# Patient Record
Sex: Female | Born: 1985
Health system: Southern US, Community
[De-identification: ages and names within clinical notes are randomized; demographics above are authoritative.]

## PROBLEM LIST (undated history)

## (undated) ENCOUNTER — Inpatient Hospital Stay (HOSPITAL_COMMUNITY): Payer: BLUE CROSS/BLUE SHIELD

## (undated) ENCOUNTER — Inpatient Hospital Stay (HOSPITAL_COMMUNITY): Payer: Self-pay

## (undated) DIAGNOSIS — R824 Acetonuria: Secondary | ICD-10-CM

## (undated) DIAGNOSIS — Z8759 Personal history of other complications of pregnancy, childbirth and the puerperium: Secondary | ICD-10-CM

## (undated) DIAGNOSIS — F419 Anxiety disorder, unspecified: Secondary | ICD-10-CM

## (undated) DIAGNOSIS — O139 Gestational [pregnancy-induced] hypertension without significant proteinuria, unspecified trimester: Secondary | ICD-10-CM

## (undated) DIAGNOSIS — F329 Major depressive disorder, single episode, unspecified: Secondary | ICD-10-CM

## (undated) DIAGNOSIS — K219 Gastro-esophageal reflux disease without esophagitis: Secondary | ICD-10-CM

## (undated) DIAGNOSIS — R34 Anuria and oliguria: Secondary | ICD-10-CM

## (undated) DIAGNOSIS — R51 Headache: Secondary | ICD-10-CM

## (undated) DIAGNOSIS — R519 Headache, unspecified: Secondary | ICD-10-CM

## (undated) DIAGNOSIS — E860 Dehydration: Secondary | ICD-10-CM

## (undated) HISTORY — PX: WISDOM TOOTH EXTRACTION: SHX21

---
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
## 2012-09-20 ENCOUNTER — Ambulatory Visit: Payer: Self-pay | Admitting: General Practice

## 2012-10-11 ENCOUNTER — Encounter (HOSPITAL_COMMUNITY): Payer: Self-pay | Admitting: Emergency Medicine

## 2012-10-11 ENCOUNTER — Emergency Department (HOSPITAL_COMMUNITY)
Admission: EM | Admit: 2012-10-11 | Discharge: 2012-10-11 | Disposition: A | Discharge status: Home / Self Care | Payer: BC Managed Care – PPO | Attending: Emergency Medicine | Admitting: Emergency Medicine

## 2012-10-11 ENCOUNTER — Emergency Department (HOSPITAL_COMMUNITY): Payer: BC Managed Care – PPO

## 2012-10-11 DIAGNOSIS — R209 Unspecified disturbances of skin sensation: Secondary | ICD-10-CM | POA: Insufficient documentation

## 2012-10-11 DIAGNOSIS — R0602 Shortness of breath: Secondary | ICD-10-CM | POA: Insufficient documentation

## 2012-10-11 DIAGNOSIS — R0789 Other chest pain: Secondary | ICD-10-CM

## 2012-10-11 DIAGNOSIS — Z79899 Other long term (current) drug therapy: Secondary | ICD-10-CM | POA: Insufficient documentation

## 2012-10-11 DIAGNOSIS — R42 Dizziness and giddiness: Secondary | ICD-10-CM | POA: Insufficient documentation

## 2012-10-11 LAB — CBC WITH DIFFERENTIAL/PLATELET
Basophils Absolute: 0 10*3/uL (ref 0.0–0.1)
Basophils Relative: 0 % (ref 0–1)
Eosinophils Absolute: 0.3 10*3/uL (ref 0.0–0.7)
HCT: 41.2 % (ref 36.0–46.0)
MCH: 31.4 pg (ref 26.0–34.0)
MCHC: 35.9 g/dL (ref 30.0–36.0)
Monocytes Absolute: 0.6 10*3/uL (ref 0.1–1.0)
Neutro Abs: 4.2 10*3/uL (ref 1.7–7.7)
RDW: 11.8 % (ref 11.5–15.5)

## 2012-10-11 LAB — URINE MICROSCOPIC-ADD ON

## 2012-10-11 LAB — BASIC METABOLIC PANEL
BUN: 11 mg/dL (ref 6–23)
Chloride: 104 mEq/L (ref 96–112)
Creatinine, Ser: 0.57 mg/dL (ref 0.50–1.10)
GFR calc Af Amer: >90 mL/min (ref >90)
GFR calc non Af Amer: >90 mL/min (ref >90)
Glucose, Bld: 89 mg/dL (ref 70–99)

## 2012-10-11 LAB — PREGNANCY, URINE: Preg Test, Ur: NEGATIVE

## 2012-10-11 LAB — URINALYSIS, ROUTINE W REFLEX MICROSCOPIC
Ketones, ur: NEGATIVE mg/dL
Leukocytes, UA: NEGATIVE
Protein, ur: NEGATIVE mg/dL
Urobilinogen, UA: 0.2 mg/dL (ref 0.0–1.0)

## 2012-10-11 MED ORDER — FAMOTIDINE 20 MG PO TABS: 20.0000 mg | ORAL_TABLET | Freq: Two times a day (BID) | ORAL | Status: DC

## 2012-10-11 MED ORDER — GI COCKTAIL ~~LOC~~
30.0000 mL | Freq: Once | ORAL | Status: AC
  Administered 2012-10-11: 30 mL via ORAL
  Filled 2012-10-11: qty 30

## 2012-10-11 NOTE — Progress Notes (Signed)
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  She has been having episodes of chest pain, has seen Dr. Phillips Odor, and has had echocardiogram in the recent past.  Her lab workup today is entirely negative.  The plan for further workup is to see a pulmonologist.  She was advised that her exam and lab tests were reassuringly normal, and that it is safe to go home and to return to work in 2 days.

## 2012-10-11 NOTE — ED Notes (Signed)
Pt reports chest pressure has decreased, now rates it a 3.

## 2012-10-11 NOTE — ED Provider Notes (Signed)
6:34 PM  Date: 10/11/2012  Rate: 70  Rhythm: normal sinus rhythm  QRS Axis: normal  Intervals: normal  ST/T Wave abnormalities: normal  Conduction Disutrbances:none  Narrative Interpretation: Normal EKG  Old EKG Reviewed: none available    Carleene Cooper III, MD 10/11/12 [phone]

## 2012-10-11 NOTE — ED Notes (Signed)
Discharge instructions reviewed with pt, questions answered. Pt verbalized understanding.  

## 2012-10-11 NOTE — ED Notes (Signed)
Pt states she was at work and developed chest pain, sob and dizziness. Pt states her extremities are tingling.

## 2012-10-12 NOTE — ED Provider Notes (Signed)
History     CSN: [id number]  Arrival date & time 10/11/12  1818   First MD Initiated Contact with Patient 10/11/12 2020      Chief Complaint  Patient presents with  . Chest Pain  . Dizziness  . Shortness of Breath    (Consider location/radiation/quality/duration/timing/severity/associated sxs/prior treatment) HPI Comments: Carrie Marsh presents with another occurrence of hot flushing,  Followed by mid sternal chest pressure,  Shortness of breath which escalated to peripheral numbness and near syncope while at work tonight.  She has a history of previous episodes of similar events and has been evaluated with an echocardiogram in [place] which was found to be normal, except for a small mitral valve prolapse.  She is next anticipating an evaluation by pulmonology to assess for possible asthma as these episodes are sometimes accompanied by wheezing.  Her pcp has given her an albuterol inhaler which is moderately helpful.  She did not have wheezing with tonights event.  The episode tonight lasted about 15 minutes and improved after she sat down and "caught her breath".  She still had residual mid sternal chest pressure pm first arrival here which is resolved.  Patient is a 26 y.o. female presenting with shortness of breath. The history is provided by the patient.  Shortness of Breath  Associated symptoms include shortness of breath. Pertinent negatives include no chest pain, no fever and no sore throat.    History reviewed. No pertinent past medical history.  History reviewed. No pertinent past surgical history.  History reviewed. No pertinent family history.  History  Substance Use Topics  . Smoking status: Not on file  . Smokeless tobacco: Not on file  . Alcohol Use: No    OB History    Grav Para Term Preterm Abortions TAB SAB Ect Mult Living                  Review of Systems  Constitutional: Negative for fever.  HENT: Negative for congestion, sore throat and neck  pain.   Eyes: Negative.   Respiratory: Positive for chest tightness and shortness of breath.   Cardiovascular: Negative for chest pain, palpitations and leg swelling.  Gastrointestinal: Negative for nausea and abdominal pain.  Genitourinary: Negative.   Musculoskeletal: Negative for joint swelling and arthralgias.  Skin: Negative.  Negative for rash and wound.  Neurological: Positive for light-headedness and numbness. Negative for dizziness, weakness and headaches.  Hematological: Negative.   Psychiatric/Behavioral: Negative.     Allergies  Review of patient's allergies indicates no known allergies.  Home Medications   Current Outpatient Rx  Name  Route  Sig  Dispense  Refill  . ALBUTEROL SULFATE HFA 108 (90 BASE) MCG/ACT IN AERS   Inhalation   Inhale 2 puffs into the lungs every 6 (six) hours as needed. For shortness of breath         . FLUTICASONE PROPIONATE 50 MCG/ACT NA SUSP   Nasal   Place 2 sprays into the nose daily.         Marland Kitchen NORGESTIM-ETH ESTRAD TRIPHASIC 0.18/0.215/0.25 MG-35 MCG PO TABS   Oral   Take 1 tablet by mouth daily.         Marland Kitchen FAMOTIDINE 20 MG PO TABS   Oral   Take 1 tablet (20 mg total) by mouth 2 (two) times daily.   30 tablet   0     BP 116/77  Pulse 66  Temp 97.3 F (36.3 C) (Oral)  Resp 18  Ht  5\' 2"  (1.575 m)  Wt 142 lb (64.411 kg)  BMI 25.97 kg/m2  SpO2 98%  LMP 10/11/2012  Physical Exam  Nursing note and vitals reviewed. Constitutional: She appears well-developed and well-nourished.  HENT:  Head: Normocephalic and atraumatic.  Eyes: Conjunctivae normal are normal.  Neck: Normal range of motion.  Cardiovascular: Normal rate, regular rhythm, normal heart sounds and intact distal pulses.   No murmur heard. Pulmonary/Chest: Effort normal and breath sounds normal. No respiratory distress. She has no wheezes. She has no rales. She exhibits no tenderness.  Abdominal: Soft. Bowel sounds are normal. There is no tenderness.   Musculoskeletal: Normal range of motion.  Neurological: She is alert.  Skin: Skin is warm and dry.  Psychiatric: She has a normal mood and affect.    ED Course  Procedures (including critical care time)  Labs Reviewed  URINALYSIS, ROUTINE W REFLEX MICROSCOPIC - Abnormal; Notable for the following:    Specific Gravity, Urine >1.030 (*)     Hgb urine dipstick TRACE (*)     All other components within normal limits  TSH - Abnormal; Notable for the following:    TSH 5.410 (*)     All other components within normal limits  URINE MICROSCOPIC-ADD ON - Abnormal; Notable for the following:    Squamous Epithelial / LPF FEW (*)     All other components within normal limits  PREGNANCY, URINE  TROPONIN I  BASIC METABOLIC PANEL  CBC WITH DIFFERENTIAL   Dg Chest 2 View  10/11/2012  *RADIOLOGY REPORT*  Clinical Data: Shortness of breath  CHEST - 2 VIEW  Comparison: None.  Findings: Lungs are clear. No pleural effusion or pneumothorax. The cardiomediastinal contours are within normal limits. The visualized bones and soft tissues are without significant appreciable abnormality.  IMPRESSION: No radiographic evidence of acute cardiopulmonary process.   Original Report Authenticated By: Jearld Lesch, M.D.      1. Chest pain, non-cardiac       MDM  Pt was also seen by Dr Ignacia Palma prior to dc home.  Suspect pt may have anxiety/stress as main source of these intermittent episodes although patient denies having increased stress during these episodes.  She was encouraged to f/u with pcp/ and pulmonology for further management.  Patients labs and/or radiological studies were reviewed during the medical decision making and disposition process.   Pt is perc negative, doubt PE.  ekg normal, cxr clear.        Burgess Amor, [place] 10/12/12 1732

## 2012-10-13 NOTE — ED Provider Notes (Signed)
Medical screening examination/treatment/procedure(s) were conducted as a shared visit with non-physician practitioner(s) and myself.  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 She has been having episodes of chest pain, has seen Dr. Phillips Odor, and has had echocardiogram in the recent past. Her lab workup today is entirely negative. The plan for further workup is to see a pulmonologist. She was advised that her exam and lab tests were reassuringly normal, and that it is safe to go home and to return to work in 2 days.       Carleene Cooper III, MD 10/13/12 [phone]

## 2013-07-24 LAB — OB RESULTS CONSOLE HEPATITIS B SURFACE ANTIGEN: Hepatitis B Surface Ag: NEGATIVE

## 2013-07-24 LAB — OB RESULTS CONSOLE ABO/RH: RH Type: NEGATIVE

## 2013-07-24 LAB — OB RESULTS CONSOLE RUBELLA ANTIBODY, IGM: Rubella: NON-IMMUNE/NOT IMMUNE

## 2013-07-24 LAB — OB RESULTS CONSOLE GC/CHLAMYDIA
CHLAMYDIA, DNA PROBE: NEGATIVE
Gonorrhea: NEGATIVE

## 2013-07-24 LAB — OB RESULTS CONSOLE RPR: RPR: NONREACTIVE

## 2013-07-24 LAB — OB RESULTS CONSOLE ANTIBODY SCREEN: Antibody Screen: NEGATIVE

## 2013-07-24 LAB — OB RESULTS CONSOLE HIV ANTIBODY (ROUTINE TESTING): HIV: NONREACTIVE

## 2014-01-17 ENCOUNTER — Encounter (HOSPITAL_COMMUNITY): Payer: Self-pay | Admitting: *Deleted

## 2014-01-17 ENCOUNTER — Inpatient Hospital Stay (HOSPITAL_COMMUNITY)
Admission: AD | Admit: 2014-01-17 | Discharge: 2014-01-17 | Disposition: A | Discharge status: Home / Self Care | Payer: BC Managed Care – PPO | Source: Ambulatory Visit | Attending: Obstetrics and Gynecology | Admitting: Obstetrics and Gynecology

## 2014-01-17 ENCOUNTER — Inpatient Hospital Stay (HOSPITAL_COMMUNITY): Payer: BC Managed Care – PPO

## 2014-01-17 DIAGNOSIS — Z87891 Personal history of nicotine dependence: Secondary | ICD-10-CM | POA: Insufficient documentation

## 2014-01-17 DIAGNOSIS — O139 Gestational [pregnancy-induced] hypertension without significant proteinuria, unspecified trimester: Secondary | ICD-10-CM | POA: Insufficient documentation

## 2014-01-17 DIAGNOSIS — O169 Unspecified maternal hypertension, unspecified trimester: Secondary | ICD-10-CM

## 2014-01-17 LAB — URINALYSIS, ROUTINE W REFLEX MICROSCOPIC
Bilirubin Urine: NEGATIVE
Glucose, UA: NEGATIVE mg/dL
Hgb urine dipstick: NEGATIVE
KETONES UR: NEGATIVE mg/dL
NITRITE: NEGATIVE
PROTEIN: NEGATIVE mg/dL
Specific Gravity, Urine: 1.01 (ref 1.005–1.030)
UROBILINOGEN UA: 0.2 mg/dL (ref 0.0–1.0)
pH: 7 (ref 5.0–8.0)

## 2014-01-17 LAB — COMPREHENSIVE METABOLIC PANEL
ALT: 11 U/L (ref 0–35)
AST: 17 U/L (ref 0–37)
Albumin: 2.6 g/dL — ABNORMAL LOW (ref 3.5–5.2)
Alkaline Phosphatase: 145 U/L — ABNORMAL HIGH (ref 39–117)
BUN: 4 mg/dL — ABNORMAL LOW (ref 6–23)
CALCIUM: 8.7 mg/dL (ref 8.4–10.5)
CO2: 22 meq/L (ref 19–32)
CREATININE: 0.43 mg/dL — AB (ref 0.50–1.10)
Chloride: 106 mEq/L (ref 96–112)
GLUCOSE: 94 mg/dL (ref 70–99)
Potassium: 3.2 mEq/L — ABNORMAL LOW (ref 3.7–5.3)
SODIUM: 140 meq/L (ref 137–147)
Total Bilirubin: <0.2 mg/dL — ABNORMAL LOW (ref 0.3–1.2)
Total Protein: 6.2 g/dL (ref 6.0–8.3)

## 2014-01-17 LAB — CBC
HEMATOCRIT: 34.2 % — AB (ref 36.0–46.0)
HEMOGLOBIN: 12.2 g/dL (ref 12.0–15.0)
MCH: 31.9 pg (ref 26.0–34.0)
MCHC: 35.7 g/dL (ref 30.0–36.0)
MCV: 89.5 fL (ref 78.0–100.0)
Platelets: 207 10*3/uL (ref 150–400)
RBC: 3.82 MIL/uL — AB (ref 3.87–5.11)
RDW: 13.9 % (ref 11.5–15.5)
WBC: 10.2 10*3/uL (ref 4.0–10.5)

## 2014-01-17 LAB — URINE MICROSCOPIC-ADD ON

## 2014-01-17 LAB — PROTEIN / CREATININE RATIO, URINE
Creatinine, Urine: 55.58 mg/dL
PROTEIN CREATININE RATIO: 0.17 — AB (ref 0.00–0.15)
Total Protein, Urine: 9.7 mg/dL

## 2014-01-17 LAB — URIC ACID: Uric Acid, Serum: 2.6 mg/dL (ref 2.4–7.0)

## 2014-01-17 NOTE — MAU Note (Signed)
PT SAYS SHE WENT TO OFFICE TODAY FOR ROUTINE APPOINTMENT-   HER BP WAS ELEVATED  AND HAS BEEN  FOR 3 VISITS -  ALL  3 VISITS - NEG FOR  PROTEIN.    HAD 1 CRAMP TODAY..  DENIES HSV AND  MRSA.  NO VE IN OFFICE TODAY.    HAS H/A - STARTED AT  2PM.    HAS BEEN GETTING  H/A'S-   YESTERDAY- NO MEDS-  WENT AWAY.  TODAY'S H/A  IS NOT WORSE H/A IN LIFE.      1 WEEK AGO SHE SAW BLACK SPOTS.    LAST Saturday  SHE STARTED FEELING LIKE HER VISION WAS FOGGY-  COMES AND GOES- PLUS FELT LIGHT HEADED.  -    NONE OF THIS PRESENT NOW.     SAYS HAS PAIN UNDER BOTH BREAST -  HAPPENED  TWICE  THIS WEEK-  NONE NOW.

## 2014-01-17 NOTE — MAU Provider Note (Signed)
History     CSN: [id number]  Arrival date and time: 01/17/14 1534   First Provider Initiated Contact with Patient 01/17/14 1605      No chief complaint on file.  HPI  Carrie Marsh is a [age] y.o. G1P0 at 6649w4d who was sent over from the office for evaluation of hypertension. She states that for her last 3 visits. She has had some elevated blood pressures. She has checked it at home a few times and it has been elevated then as well. She reports occasional headaches, and has a headache currently. She declines any medication for her headache at this time. She denies any other complications with this pregnancy. She reports normal fetal movement.   History reviewed. No pertinent past medical history.  Past Surgical History  Procedure Laterality Date  . Wisdom tooth extraction      History reviewed. No pertinent family history.  History  Substance Use Topics  . Smoking status: Former Games developermoker  . Smokeless tobacco: Not on file  . Alcohol Use: No    Allergies: No Known Allergies  Prescriptions prior to admission  Medication Sig Dispense Refill  . acetaminophen (TYLENOL) 500 MG tablet Take 500 mg by mouth every 6 (six) hours as needed for headache.      . albuterol (PROAIR HFA) 108 (90 BASE) MCG/ACT inhaler Inhale 2 puffs into the lungs every 6 (six) hours as needed. For shortness of breath      . Prenatal Vit-Fe Fumarate-FA (PRENATAL MULTIVITAMIN) TABS tablet Take 1 tablet by mouth daily at 12 noon.      . ranitidine (ZANTAC) 75 MG tablet Take 75 mg by mouth 2 (two) times daily.      . famotidine (PEPCID) 20 MG tablet Take 1 tablet (20 mg total) by mouth 2 (two) times daily.  30 tablet  0  . fluticasone (FLONASE) 50 MCG/ACT nasal spray Place 2 sprays into the nose daily.      . Norgestimate-Ethinyl Estradiol Triphasic (TRI-SPRINTEC) 0.18/0.215/0.25 MG-35 MCG tablet Take 1 tablet by mouth daily.        ROS Physical Exam   Blood pressure 125/70, pulse 81, temperature 98.3 F  (36.8 C), temperature source Oral, resp. rate 20, height 5\' 1"  (1.549 m), weight 75.864 kg (167 lb 4 oz).  Physical Exam  Nursing note and vitals reviewed. Constitutional: She is oriented to person, place, and time. She appears well-developed and well-nourished. No distress.  Cardiovascular: Normal rate.   Respiratory: Effort normal.  GI: Soft. There is no tenderness.  Musculoskeletal: She exhibits no edema.  Neurological: She is alert and oriented to person, place, and time.  Patellar reflexes: 1+   Skin: Skin is warm and dry.  Psychiatric: She has a normal mood and affect.   FHT 135, moderate with 15x15 accels and occasional variable Toco: irregular UC, not noticed by the patient.   MAU Course  Procedures Results for orders placed during the hospital encounter of 01/17/14 (from the past 24 hour(s))  URINALYSIS, ROUTINE W REFLEX MICROSCOPIC     Status: Abnormal   Collection Time    01/17/14  3:40 PM      Result Value Ref Range   Color, Urine YELLOW  YELLOW   APPearance CLEAR  CLEAR   Specific Gravity, Urine 1.010  1.005 - 1.030   pH 7.0  5.0 - 8.0   Glucose, UA NEGATIVE  NEGATIVE mg/dL   Hgb urine dipstick NEGATIVE  NEGATIVE   Bilirubin Urine NEGATIVE  NEGATIVE  Ketones, ur NEGATIVE  NEGATIVE mg/dL   Protein, ur NEGATIVE  NEGATIVE mg/dL   Urobilinogen, UA 0.2  0.0 - 1.0 mg/dL   Nitrite NEGATIVE  NEGATIVE   Leukocytes, UA SMALL (*) NEGATIVE  URINE MICROSCOPIC-ADD ON     Status: None   Collection Time    01/17/14  3:40 PM      Result Value Ref Range   Squamous Epithelial / LPF RARE  RARE   WBC, UA 3-6  <3 WBC/hpf   RBC / HPF 0-2  <3 RBC/hpf   Bacteria, UA RARE  RARE   Urine-Other AMORPHOUS URATES/PHOSPHATES    PROTEIN / CREATININE RATIO, URINE     Status: Abnormal   Collection Time    01/17/14  3:40 PM      Result Value Ref Range   Creatinine, Urine 55.58     Total Protein, Urine 9.7     PROTEIN CREATININE RATIO 0.17 (*) 0.00 - 0.15  CBC     Status: Abnormal    Collection Time    01/17/14  4:08 PM      Result Value Ref Range   WBC 10.2  4.0 - 10.5 K/uL   RBC 3.82 (*) 3.87 - 5.11 MIL/uL   Hemoglobin 12.2  12.0 - 15.0 g/dL   HCT 09.8 (*) 11.9 - 14.7 %   MCV 89.5  78.0 - 100.0 fL   MCH 31.9  26.0 - 34.0 pg   MCHC 35.7  30.0 - 36.0 g/dL   RDW 82.9  56.2 - 13.0 %   Platelets 207  150 - 400 K/uL  COMPREHENSIVE METABOLIC PANEL     Status: Abnormal   Collection Time    01/17/14  4:08 PM      Result Value Ref Range   Sodium 140  137 - 147 mEq/L   Potassium 3.2 (*) 3.7 - 5.3 mEq/L   Chloride 106  96 - 112 mEq/L   CO2 22  19 - 32 mEq/L   Glucose, Bld 94  70 - 99 mg/dL   BUN 4 (*) 6 - 23 mg/dL   Creatinine, Ser 8.65 (*) 0.50 - 1.10 mg/dL   Calcium 8.7  8.4 - 78.4 mg/dL   Total Protein 6.2  6.0 - 8.3 g/dL   Albumin 2.6 (*) 3.5 - 5.2 g/dL   AST 17  0 - 37 U/L   ALT 11  0 - 35 U/L   Alkaline Phosphatase 145 (*) 39 - 117 U/L   Total Bilirubin <0.2 (*) 0.3 - 1.2 mg/dL   GFR calc non Af Amer >90  >90 mL/min   GFR calc Af Amer >90  >90 mL/min  URIC ACID     Status: None   Collection Time    01/17/14  4:08 PM      Result Value Ref Range   Uric Acid, Serum 2.6  2.4 - 7.0 mg/dL  6962: D/W Dr. Arelia Sneddon. Will send for BPP/AFI while labs are pending.  1700: Care turned over to Pamelia Hoit, NP Preliminary BPP report 6/8 (fetal breathing not observed) AFI 75% Discussed with Dr. Arelia Sneddon- pt may d/c home and f/u with office tomorrow for BP recheck and eval BP stable BP 120/76 [name], [name] 01/17/2014, 4:20 PM Reactive NST  Assessment and Plan  Hypertension in pregnancy- nl labs D/c home f/u in office in the morning (call in morning) Normal BPP

## 2014-01-31 LAB — OB RESULTS CONSOLE GBS: STREP GROUP B AG: NEGATIVE

## 2014-02-14 ENCOUNTER — Encounter (HOSPITAL_COMMUNITY): Payer: Self-pay | Admitting: *Deleted

## 2014-02-14 ENCOUNTER — Inpatient Hospital Stay (HOSPITAL_COMMUNITY)
Admission: AD | Admit: 2014-02-14 | Discharge: 2014-02-14 | Disposition: A | Discharge status: Home / Self Care | Payer: BC Managed Care – PPO | Source: Ambulatory Visit | Attending: Obstetrics and Gynecology | Admitting: Obstetrics and Gynecology

## 2014-02-14 DIAGNOSIS — O479 False labor, unspecified: Secondary | ICD-10-CM | POA: Insufficient documentation

## 2014-02-14 DIAGNOSIS — O4690 Antepartum hemorrhage, unspecified, unspecified trimester: Secondary | ICD-10-CM | POA: Insufficient documentation

## 2014-02-14 MED ORDER — OXYCODONE-ACETAMINOPHEN 5-325 MG PO TABS: 2.0000 | ORAL_TABLET | Freq: Once | ORAL | Status: DC

## 2014-02-14 NOTE — MAU Note (Signed)
Pt G1 at 37.4wks, having contractions and small amt of bleeding.  Pt seen in the office yesterday SVE 2cm.

## 2014-02-14 NOTE — Discharge Instructions (Signed)

## 2014-02-15 ENCOUNTER — Encounter (HOSPITAL_COMMUNITY): Payer: Self-pay | Admitting: *Deleted

## 2014-02-15 ENCOUNTER — Inpatient Hospital Stay (HOSPITAL_COMMUNITY)
Admission: AD | Admit: 2014-02-15 | Discharge: 2014-02-19 | DRG: 765 | Disposition: A | Discharge status: Home / Self Care | Payer: BC Managed Care – PPO | Source: Ambulatory Visit | Attending: Obstetrics and Gynecology | Admitting: Obstetrics and Gynecology

## 2014-02-15 DIAGNOSIS — O411090 Infection of amniotic sac and membranes, unspecified, unspecified trimester, not applicable or unspecified: Secondary | ICD-10-CM | POA: Diagnosis present

## 2014-02-15 DIAGNOSIS — O1490 Unspecified pre-eclampsia, unspecified trimester: Secondary | ICD-10-CM | POA: Diagnosis present

## 2014-02-15 DIAGNOSIS — K219 Gastro-esophageal reflux disease without esophagitis: Secondary | ICD-10-CM | POA: Diagnosis present

## 2014-02-15 DIAGNOSIS — O1414 Severe pre-eclampsia complicating childbirth: Principal | ICD-10-CM | POA: Diagnosis present

## 2014-02-15 HISTORY — DX: Gastro-esophageal reflux disease without esophagitis: K21.9

## 2014-02-15 LAB — CBC
HCT: 36 % (ref 36.0–46.0)
Hemoglobin: 12.9 g/dL (ref 12.0–15.0)
MCH: 32 pg (ref 26.0–34.0)
MCHC: 35.8 g/dL (ref 30.0–36.0)
MCV: 89.3 fL (ref 78.0–100.0)
Platelets: 189 10*3/uL (ref 150–400)
RBC: 4.03 MIL/uL (ref 3.87–5.11)
RDW: 13.7 % (ref 11.5–15.5)
WBC: 10.4 10*3/uL (ref 4.0–10.5)

## 2014-02-15 LAB — COMPREHENSIVE METABOLIC PANEL
ALT: 11 U/L (ref 0–35)
AST: 18 U/L (ref 0–37)
Albumin: 2.5 g/dL — ABNORMAL LOW (ref 3.5–5.2)
Alkaline Phosphatase: 179 U/L — ABNORMAL HIGH (ref 39–117)
BUN: 4 mg/dL — AB (ref 6–23)
CO2: 22 mEq/L (ref 19–32)
CREATININE: 0.47 mg/dL — AB (ref 0.50–1.10)
Calcium: 8.7 mg/dL (ref 8.4–10.5)
Chloride: 106 mEq/L (ref 96–112)
GFR calc Af Amer: >90 mL/min (ref >90)
GFR calc non Af Amer: >90 mL/min (ref >90)
Glucose, Bld: 65 mg/dL — ABNORMAL LOW (ref 70–99)
Potassium: 3.3 mEq/L — ABNORMAL LOW (ref 3.7–5.3)
Sodium: 142 mEq/L (ref 137–147)
TOTAL PROTEIN: 5.9 g/dL — AB (ref 6.0–8.3)
Total Bilirubin: 0.3 mg/dL (ref 0.3–1.2)

## 2014-02-15 LAB — RPR

## 2014-02-15 LAB — URIC ACID: URIC ACID, SERUM: 3.4 mg/dL (ref 2.4–7.0)

## 2014-02-15 MED ORDER — LACTATED RINGERS IV SOLN
INTRAVENOUS | Status: DC
  Administered 2014-02-15 – 2014-02-16 (×4): via INTRAVENOUS

## 2014-02-15 MED ORDER — HYDROXYZINE HCL 50 MG PO TABS: 50.0000 mg | ORAL_TABLET | Freq: Four times a day (QID) | ORAL | Status: DC | PRN

## 2014-02-15 MED ORDER — TERBUTALINE SULFATE 1 MG/ML IJ SOLN: 0.2500 mg | Freq: Once | INTRAMUSCULAR | Status: AC | PRN

## 2014-02-15 MED ORDER — MAGNESIUM SULFATE BOLUS VIA INFUSION
4.0000 g | Freq: Once | INTRAVENOUS | Status: AC
  Administered 2014-02-15: 4 g via INTRAVENOUS
  Filled 2014-02-15: qty 500

## 2014-02-15 MED ORDER — MISOPROSTOL 25 MCG QUARTER TABLET
25.0000 ug | ORAL_TABLET | ORAL | Status: DC | PRN
  Administered 2014-02-15 – 2014-02-16 (×3): 25 ug via VAGINAL
  Filled 2014-02-15 (×3): qty 0.25

## 2014-02-15 MED ORDER — ACETAMINOPHEN 325 MG PO TABS
650.0000 mg | ORAL_TABLET | ORAL | Status: DC | PRN
  Administered 2014-02-15: 650 mg via ORAL
  Filled 2014-02-15: qty 2

## 2014-02-15 MED ORDER — OXYTOCIN BOLUS FROM INFUSION: 500.0000 mL | INTRAVENOUS | Status: DC

## 2014-02-15 MED ORDER — OXYTOCIN 40 UNITS IN LACTATED RINGERS INFUSION - SIMPLE MED: 62.5000 mL/h | INTRAVENOUS | Status: DC

## 2014-02-15 MED ORDER — LACTATED RINGERS IV SOLN: 500.0000 mL | INTRAVENOUS | Status: DC | PRN

## 2014-02-15 MED ORDER — CITRIC ACID-SODIUM CITRATE 334-500 MG/5ML PO SOLN
30.0000 mL | ORAL | Status: DC | PRN
  Administered 2014-02-16: 30 mL via ORAL
  Filled 2014-02-15: qty 15

## 2014-02-15 MED ORDER — OXYCODONE-ACETAMINOPHEN 5-325 MG PO TABS: 1.0000 | ORAL_TABLET | ORAL | Status: DC | PRN

## 2014-02-15 MED ORDER — ONDANSETRON HCL 4 MG/2ML IJ SOLN
4.0000 mg | Freq: Four times a day (QID) | INTRAMUSCULAR | Status: DC | PRN
  Administered 2014-02-16: 4 mg via INTRAVENOUS
  Filled 2014-02-15: qty 2

## 2014-02-15 MED ORDER — ZOLPIDEM TARTRATE 5 MG PO TABS: 5.0000 mg | ORAL_TABLET | Freq: Every evening | ORAL | Status: DC | PRN

## 2014-02-15 MED ORDER — MAGNESIUM SULFATE 40 G IN LACTATED RINGERS - SIMPLE
2.0000 g/h | INTRAVENOUS | Status: DC
  Administered 2014-02-16: 2 g/h via INTRAVENOUS
  Filled 2014-02-15 (×2): qty 500

## 2014-02-15 MED ORDER — LIDOCAINE HCL (PF) 1 % IJ SOLN: 30.0000 mL | INTRAMUSCULAR | Status: DC | PRN

## 2014-02-15 MED ORDER — PANTOPRAZOLE SODIUM 40 MG PO TBEC
40.0000 mg | DELAYED_RELEASE_TABLET | Freq: Every day | ORAL | Status: DC
  Administered 2014-02-15: 40 mg via ORAL
  Filled 2014-02-15 (×3): qty 1

## 2014-02-15 MED ORDER — IBUPROFEN 600 MG PO TABS
600.0000 mg | ORAL_TABLET | Freq: Four times a day (QID) | ORAL | Status: DC | PRN
Start: 2014-02-15 — End: 2014-02-16

## 2014-02-15 MED ORDER — FLEET ENEMA 7-19 GM/118ML RE ENEM: 1.0000 | ENEMA | RECTAL | Status: DC | PRN

## 2014-02-15 MED ORDER — BUTORPHANOL TARTRATE 1 MG/ML IJ SOLN
1.0000 mg | INTRAMUSCULAR | Status: DC | PRN
  Administered 2014-02-15: 1 mg via INTRAVENOUS
  Filled 2014-02-15: qty 1

## 2014-02-15 NOTE — H&P (Signed)
Steva ColderHeather C Sanden is a 28 y.o. female presenting for IOL. BPs have been labile. Today in office BP is 150/110. Patient feels jittery and has blurry vision. She had HA yesterday. Urine protein is negative in office. Maternal Medical History:  Fetal activity: Perceived fetal activity is normal.      OB History   Grav Para Term Preterm Abortions TAB SAB Ect Mult Living   1              Past Medical History  Diagnosis Date  . GERD (gastroesophageal reflux disease)    Past Surgical History  Procedure Laterality Date  . Wisdom tooth extraction     Family History: family history is not on file. Social History:  reports that she has been passively smoking.  She has never used smokeless tobacco. She reports that she does not drink alcohol or use illicit drugs.   Prenatal Transfer Tool  Maternal Diabetes: No Genetic Screening: Normal Maternal Ultrasounds/Referrals: Normal Fetal Ultrasounds or other Referrals:  None Maternal Substance Abuse:  No Significant Maternal Medications:  None Significant Maternal Lab Results:  None Other Comments:  None  Review of Systems  Eyes: Positive for blurred vision.  Gastrointestinal: Negative for abdominal pain.  Neurological: Positive for headaches.    Dilation: 2 Effacement (%): 50 Station: -2 Exam by:: Camelia EngK. Haynes, RN Blood pressure 158/94, pulse 67, temperature 98.3 F (36.8 C), temperature source Oral, resp. rate 20, height 5\' 1"  (1.549 m), weight 81.738 kg (180 lb 3.2 oz). Maternal Exam:  Uterine Assessment: Contraction strength is mild.  Contraction frequency is irregular.   Abdomen: Fetal presentation: vertex     Fetal Exam Fetal State Assessment: Category I - tracings are normal.     Physical Exam  HENT:  + periorbital edema   Cardiovascular: Normal rate and regular rhythm.   Respiratory: Effort normal and breath sounds normal.  GI: Soft. There is no tenderness.  Neurological:  DTR 3+ without clonus    Bedside U/S =  vtx  Cx 2/50 per nurse check  Results for orders placed during the hospital encounter of 02/15/14 (from the past 24 hour(s))  CBC     Status: None   Collection Time    02/15/14 12:25 PM      Result Value Ref Range   WBC 10.4  4.0 - 10.5 K/uL   RBC 4.03  3.87 - 5.11 MIL/uL   Hemoglobin 12.9  12.0 - 15.0 g/dL   HCT 16.136.0  09.636.0 - 04.546.0 %   MCV 89.3  78.0 - 100.0 fL   MCH 32.0  26.0 - 34.0 pg   MCHC 35.8  30.0 - 36.0 g/dL   RDW 40.913.7  81.111.5 - 91.415.5 %   Platelets 189  150 - 400 K/uL  COMPREHENSIVE METABOLIC PANEL     Status: Abnormal   Collection Time    02/15/14 12:25 PM      Result Value Ref Range   Sodium 142  137 - 147 mEq/L   Potassium 3.3 (*) 3.7 - 5.3 mEq/L   Chloride 106  96 - 112 mEq/L   CO2 22  19 - 32 mEq/L   Glucose, Bld 65 (*) 70 - 99 mg/dL   BUN 4 (*) 6 - 23 mg/dL   Creatinine, Ser 7.820.47 (*) 0.50 - 1.10 mg/dL   Calcium 8.7  8.4 - 95.610.5 mg/dL   Total Protein 5.9 (*) 6.0 - 8.3 g/dL   Albumin 2.5 (*) 3.5 - 5.2 g/dL   AST 18  0 - 37 U/L   ALT 11  0 - 35 U/L   Alkaline Phosphatase 179 (*) 39 - 117 U/L   Total Bilirubin 0.3  0.3 - 1.2 mg/dL   GFR calc non Af Amer >90  >90 mL/min   GFR calc Af Amer >90  >90 mL/min  URIC ACID     Status: None   Collection Time    02/15/14 12:25 PM      Result Value Ref Range   Uric Acid, Serum 3.4  2.4 - 7.0 mg/dL      Prenatal labs: ABO, Rh:   Antibody:   Rubella:   RPR:    HBsAg:    HIV:    GBS:     Assessment/Plan: 28 yo G1P0 at 5737 5/7 weeks with severe preeclampsia  D/W patient and husband above, recommend labor induction D/W two stage induction with risks of fetal distress, uterine hyperstimulation, failed induction and increased risk of cesarean section Also discussed possible immature fetal lung status All questions answered.   Roselle LocusJames E Eladia Frame II 02/15/2014, 2:39 PM

## 2014-02-15 NOTE — Progress Notes (Signed)
Updated Provider on uc pattern and cervical change.  Will hold cytotec.

## 2014-02-15 NOTE — Progress Notes (Signed)
FHT reactive UCs irregular  Filed Vitals:   02/15/14 1601  BP: 136/79  Pulse: 76  Temp:   Resp: 20    DTR 2+  Labs OK  A/P: continue two stage induction

## 2014-02-16 ENCOUNTER — Inpatient Hospital Stay (HOSPITAL_COMMUNITY): Payer: BC Managed Care – PPO | Admitting: Anesthesiology

## 2014-02-16 ENCOUNTER — Encounter (HOSPITAL_COMMUNITY): Payer: BC Managed Care – PPO | Admitting: Anesthesiology

## 2014-02-16 ENCOUNTER — Encounter (HOSPITAL_COMMUNITY)
Admission: AD | Disposition: A | Discharge status: Home / Self Care | Payer: Self-pay | Source: Ambulatory Visit | Attending: Obstetrics and Gynecology

## 2014-02-16 ENCOUNTER — Encounter (HOSPITAL_COMMUNITY): Payer: Self-pay

## 2014-02-16 LAB — URIC ACID
URIC ACID, SERUM: 3.4 mg/dL (ref 2.4–7.0)
URIC ACID, SERUM: 3.7 mg/dL (ref 2.4–7.0)

## 2014-02-16 LAB — COMPREHENSIVE METABOLIC PANEL
ALT: 9 U/L (ref 0–35)
ALT: 12 U/L (ref 0–35)
AST: 20 U/L (ref 0–37)
AST: 21 U/L (ref 0–37)
Albumin: 2.5 g/dL — ABNORMAL LOW (ref 3.5–5.2)
Albumin: 2.6 g/dL — ABNORMAL LOW (ref 3.5–5.2)
Alkaline Phosphatase: 179 U/L — ABNORMAL HIGH (ref 39–117)
Alkaline Phosphatase: 207 U/L — ABNORMAL HIGH (ref 39–117)
BILIRUBIN TOTAL: 0.3 mg/dL (ref 0.3–1.2)
BUN: 3 mg/dL — AB (ref 6–23)
BUN: 3 mg/dL — AB (ref 6–23)
CO2: 24 meq/L (ref 19–32)
CO2: 22 meq/L (ref 19–32)
CREATININE: 0.46 mg/dL — AB (ref 0.50–1.10)
CREATININE: 0.49 mg/dL — AB (ref 0.50–1.10)
Calcium: 7.7 mg/dL — ABNORMAL LOW (ref 8.4–10.5)
Calcium: 7.8 mg/dL — ABNORMAL LOW (ref 8.4–10.5)
Chloride: 105 mEq/L (ref 96–112)
Chloride: 105 mEq/L (ref 96–112)
GFR calc Af Amer: >90 mL/min (ref >90)
GFR calc non Af Amer: >90 mL/min (ref >90)
Glucose, Bld: 99 mg/dL (ref 70–99)
Glucose, Bld: 78 mg/dL (ref 70–99)
Potassium: 3.2 mEq/L — ABNORMAL LOW (ref 3.7–5.3)
Potassium: 3 mEq/L — ABNORMAL LOW (ref 3.7–5.3)
Sodium: 141 mEq/L (ref 137–147)
Sodium: 143 mEq/L (ref 137–147)
Total Bilirubin: 0.4 mg/dL (ref 0.3–1.2)
Total Protein: 6.1 g/dL (ref 6.0–8.3)
Total Protein: 5.9 g/dL — ABNORMAL LOW (ref 6.0–8.3)

## 2014-02-16 LAB — CBC
HCT: 39.5 % (ref 36.0–46.0)
HEMATOCRIT: 36.5 % (ref 36.0–46.0)
Hemoglobin: 13 g/dL (ref 12.0–15.0)
Hemoglobin: 14 g/dL (ref 12.0–15.0)
MCH: 31.5 pg (ref 26.0–34.0)
MCH: 31.8 pg (ref 26.0–34.0)
MCHC: 35.6 g/dL (ref 30.0–36.0)
MCHC: 35.4 g/dL (ref 30.0–36.0)
MCV: 88.4 fL (ref 78.0–100.0)
MCV: 89.8 fL (ref 78.0–100.0)
Platelets: 219 10*3/uL (ref 150–400)
Platelets: 200 10*3/uL (ref 150–400)
RBC: 4.13 MIL/uL (ref 3.87–5.11)
RBC: 4.4 MIL/uL (ref 3.87–5.11)
RDW: 14.1 % (ref 11.5–15.5)
RDW: 13.8 % (ref 11.5–15.5)
WBC: 13.4 10*3/uL — AB (ref 4.0–10.5)
WBC: 16.5 10*3/uL — ABNORMAL HIGH (ref 4.0–10.5)

## 2014-02-16 SURGERY — Surgical Case
Anesthesia: Epidural | Site: Abdomen

## 2014-02-16 MED ORDER — MENTHOL 3 MG MT LOZG: 1.0000 | LOZENGE | OROMUCOSAL | Status: DC | PRN

## 2014-02-16 MED ORDER — LACTATED RINGERS IV SOLN
INTRAVENOUS | Status: DC
Start: 2014-02-16 — End: 2014-02-16

## 2014-02-16 MED ORDER — SODIUM CHLORIDE 0.9 % IJ SOLN
INTRAMUSCULAR | Status: DC | PRN
Start: 2014-02-16 — End: 2014-02-16
  Administered 2014-02-16: 40 mL via INTRAVENOUS

## 2014-02-16 MED ORDER — MEPERIDINE HCL 25 MG/ML IJ SOLN
INTRAMUSCULAR | Status: DC | PRN
  Administered 2014-02-16 (×2): 12.5 mg via INTRAVENOUS

## 2014-02-16 MED ORDER — ONDANSETRON HCL 4 MG/2ML IJ SOLN: 4.0000 mg | INTRAMUSCULAR | Status: DC | PRN

## 2014-02-16 MED ORDER — FENTANYL CITRATE 0.05 MG/ML IJ SOLN
INTRAMUSCULAR | Status: AC
  Filled 2014-02-16: qty 2

## 2014-02-16 MED ORDER — NALBUPHINE HCL 10 MG/ML IJ SOLN: 5.0000 mg | INTRAMUSCULAR | Status: DC | PRN

## 2014-02-16 MED ORDER — PRENATAL MULTIVITAMIN CH: 1.0000 | ORAL_TABLET | Freq: Every day | ORAL | Status: DC

## 2014-02-16 MED ORDER — LANOLIN HYDROUS EX OINT: 1.0000 "application " | TOPICAL_OINTMENT | CUTANEOUS | Status: DC | PRN

## 2014-02-16 MED ORDER — CEFAZOLIN SODIUM-DEXTROSE 2-3 GM-% IV SOLR
INTRAVENOUS | Status: AC
  Filled 2014-02-16: qty 50

## 2014-02-16 MED ORDER — SODIUM BICARBONATE 8.4 % IV SOLN
INTRAVENOUS | Status: AC
  Filled 2014-02-16: qty 50

## 2014-02-16 MED ORDER — LIDOCAINE HCL (PF) 1 % IJ SOLN
INTRAMUSCULAR | Status: DC | PRN
  Administered 2014-02-16 (×3): 5 mL

## 2014-02-16 MED ORDER — ALBUTEROL SULFATE (2.5 MG/3ML) 0.083% IN NEBU: 3.0000 mL | INHALATION_SOLUTION | Freq: Four times a day (QID) | RESPIRATORY_TRACT | Status: DC | PRN

## 2014-02-16 MED ORDER — KETOROLAC TROMETHAMINE 30 MG/ML IJ SOLN
30.0000 mg | Freq: Four times a day (QID) | INTRAMUSCULAR | Status: AC | PRN
  Administered 2014-02-17: 30 mg via INTRAVENOUS
  Filled 2014-02-16: qty 1

## 2014-02-16 MED ORDER — MEPERIDINE HCL 25 MG/ML IJ SOLN
INTRAMUSCULAR | Status: AC
  Filled 2014-02-16: qty 1

## 2014-02-16 MED ORDER — BUPIVACAINE LIPOSOME 1.3 % IJ SUSP
20.0000 mL | Freq: Once | INTRAMUSCULAR | Status: AC
  Administered 2014-02-16: 20 mL
  Filled 2014-02-16: qty 20

## 2014-02-16 MED ORDER — OXYTOCIN 40 UNITS IN LACTATED RINGERS INFUSION - SIMPLE MED: 62.5000 mL/h | INTRAVENOUS | Status: AC

## 2014-02-16 MED ORDER — ONDANSETRON HCL 4 MG PO TABS: 4.0000 mg | ORAL_TABLET | ORAL | Status: DC | PRN

## 2014-02-16 MED ORDER — DIPHENHYDRAMINE HCL 25 MG PO CAPS: 25.0000 mg | ORAL_CAPSULE | ORAL | Status: DC | PRN

## 2014-02-16 MED ORDER — SODIUM CHLORIDE 0.9 % IJ SOLN
INTRAMUSCULAR | Status: AC
  Filled 2014-02-16: qty 50

## 2014-02-16 MED ORDER — MORPHINE SULFATE 0.5 MG/ML IJ SOLN
INTRAMUSCULAR | Status: AC
  Filled 2014-02-16: qty 10

## 2014-02-16 MED ORDER — DIPHENHYDRAMINE HCL 25 MG PO CAPS: 25.0000 mg | ORAL_CAPSULE | Freq: Four times a day (QID) | ORAL | Status: DC | PRN

## 2014-02-16 MED ORDER — FENTANYL CITRATE 0.05 MG/ML IJ SOLN
25.0000 ug | INTRAMUSCULAR | Status: AC
  Administered 2014-02-16: 50 ug via INTRAVENOUS

## 2014-02-16 MED ORDER — DIBUCAINE 1 % RE OINT: 1.0000 "application " | TOPICAL_OINTMENT | RECTAL | Status: DC | PRN

## 2014-02-16 MED ORDER — SCOPOLAMINE 1 MG/3DAYS TD PT72
1.0000 | MEDICATED_PATCH | Freq: Once | TRANSDERMAL | Status: DC
  Administered 2014-02-16: 1.5 mg via TRANSDERMAL

## 2014-02-16 MED ORDER — SENNOSIDES-DOCUSATE SODIUM 8.6-50 MG PO TABS
2.0000 | ORAL_TABLET | ORAL | Status: DC
  Administered 2014-02-17 – 2014-02-18 (×3): 2 via ORAL
  Filled 2014-02-16 (×3): qty 2

## 2014-02-16 MED ORDER — PHENYLEPHRINE 40 MCG/ML (10ML) SYRINGE FOR IV PUSH (FOR BLOOD PRESSURE SUPPORT)
80.0000 ug | PREFILLED_SYRINGE | INTRAVENOUS | Status: DC | PRN
  Filled 2014-02-16: qty 10

## 2014-02-16 MED ORDER — PANTOPRAZOLE SODIUM 40 MG PO TBEC
40.0000 mg | DELAYED_RELEASE_TABLET | Freq: Every day | ORAL | Status: DC
  Administered 2014-02-17: 40 mg via ORAL
  Filled 2014-02-16 (×3): qty 1

## 2014-02-16 MED ORDER — MORPHINE SULFATE (PF) 0.5 MG/ML IJ SOLN
INTRAMUSCULAR | Status: DC | PRN
  Administered 2014-02-16: 4 mg via EPIDURAL

## 2014-02-16 MED ORDER — SCOPOLAMINE 1 MG/3DAYS TD PT72
MEDICATED_PATCH | TRANSDERMAL | Status: AC
  Filled 2014-02-16: qty 1

## 2014-02-16 MED ORDER — KETOROLAC TROMETHAMINE 30 MG/ML IJ SOLN: 30.0000 mg | Freq: Four times a day (QID) | INTRAMUSCULAR | Status: AC | PRN

## 2014-02-16 MED ORDER — ZOLPIDEM TARTRATE 5 MG PO TABS: 5.0000 mg | ORAL_TABLET | Freq: Every evening | ORAL | Status: DC | PRN

## 2014-02-16 MED ORDER — SODIUM CHLORIDE 0.9 % IJ SOLN: 3.0000 mL | INTRAMUSCULAR | Status: DC | PRN

## 2014-02-16 MED ORDER — FENTANYL 2.5 MCG/ML BUPIVACAINE 1/10 % EPIDURAL INFUSION (WH - ANES)
14.0000 mL/h | INTRAMUSCULAR | Status: DC | PRN
  Administered 2014-02-16: 14 mL/h via EPIDURAL
  Filled 2014-02-16 (×2): qty 125

## 2014-02-16 MED ORDER — DIPHENHYDRAMINE HCL 50 MG/ML IJ SOLN: 12.5000 mg | INTRAMUSCULAR | Status: DC | PRN

## 2014-02-16 MED ORDER — NALOXONE HCL 0.4 MG/ML IJ SOLN: 0.4000 mg | INTRAMUSCULAR | Status: DC | PRN

## 2014-02-16 MED ORDER — FENTANYL 2.5 MCG/ML BUPIVACAINE 1/10 % EPIDURAL INFUSION (WH - ANES)
INTRAMUSCULAR | Status: DC | PRN
  Administered 2014-02-16: 14 mL/h via EPIDURAL

## 2014-02-16 MED ORDER — MEPERIDINE HCL 25 MG/ML IJ SOLN: 6.2500 mg | INTRAMUSCULAR | Status: DC | PRN

## 2014-02-16 MED ORDER — ONDANSETRON HCL 4 MG/2ML IJ SOLN
INTRAMUSCULAR | Status: AC
  Filled 2014-02-16: qty 2

## 2014-02-16 MED ORDER — SIMETHICONE 80 MG PO CHEW
80.0000 mg | CHEWABLE_TABLET | Freq: Three times a day (TID) | ORAL | Status: DC
  Filled 2014-02-16 (×4): qty 1

## 2014-02-16 MED ORDER — WITCH HAZEL-GLYCERIN EX PADS: 1.0000 "application " | MEDICATED_PAD | CUTANEOUS | Status: DC | PRN

## 2014-02-16 MED ORDER — METOCLOPRAMIDE HCL 5 MG/ML IJ SOLN: 10.0000 mg | Freq: Three times a day (TID) | INTRAMUSCULAR | Status: DC | PRN

## 2014-02-16 MED ORDER — KETOROLAC TROMETHAMINE 60 MG/2ML IM SOLN
INTRAMUSCULAR | Status: AC
Start: 2014-02-16 — End: 2014-02-17
  Filled 2014-02-16: qty 2

## 2014-02-16 MED ORDER — LACTATED RINGERS IV SOLN
INTRAVENOUS | Status: DC
  Administered 2014-02-17 (×2): via INTRAVENOUS

## 2014-02-16 MED ORDER — SIMETHICONE 80 MG PO CHEW
80.0000 mg | CHEWABLE_TABLET | ORAL | Status: DC
  Filled 2014-02-16 (×3): qty 1

## 2014-02-16 MED ORDER — TERBUTALINE SULFATE 1 MG/ML IJ SOLN: 0.2500 mg | Freq: Once | INTRAMUSCULAR | Status: DC | PRN

## 2014-02-16 MED ORDER — EPHEDRINE 5 MG/ML INJ
10.0000 mg | INTRAVENOUS | Status: DC | PRN
  Filled 2014-02-16: qty 4

## 2014-02-16 MED ORDER — DIPHENHYDRAMINE HCL 50 MG/ML IJ SOLN: 25.0000 mg | INTRAMUSCULAR | Status: DC | PRN

## 2014-02-16 MED ORDER — LACTATED RINGERS IV SOLN
INTRAVENOUS | Status: DC | PRN
  Administered 2014-02-16: 19:00:00 via INTRAVENOUS

## 2014-02-16 MED ORDER — ONDANSETRON HCL 4 MG/2ML IJ SOLN
INTRAMUSCULAR | Status: DC | PRN
  Administered 2014-02-16: 4 mg via INTRAVENOUS

## 2014-02-16 MED ORDER — LACTATED RINGERS IV SOLN
INTRAVENOUS | Status: DC | PRN
  Administered 2014-02-16: 18:00:00 via INTRAVENOUS

## 2014-02-16 MED ORDER — LACTATED RINGERS IV SOLN: 500.0000 mL | Freq: Once | INTRAVENOUS | Status: DC

## 2014-02-16 MED ORDER — EPHEDRINE 5 MG/ML INJ: 10.0000 mg | INTRAVENOUS | Status: DC | PRN

## 2014-02-16 MED ORDER — OXYCODONE-ACETAMINOPHEN 5-325 MG PO TABS
1.0000 | ORAL_TABLET | ORAL | Status: DC | PRN
  Administered 2014-02-17 – 2014-02-19 (×11): 1 via ORAL
  Filled 2014-02-16 (×11): qty 1

## 2014-02-16 MED ORDER — PHENYLEPHRINE 40 MCG/ML (10ML) SYRINGE FOR IV PUSH (FOR BLOOD PRESSURE SUPPORT): 80.0000 ug | PREFILLED_SYRINGE | INTRAVENOUS | Status: DC | PRN

## 2014-02-16 MED ORDER — MORPHINE SULFATE (PF) 0.5 MG/ML IJ SOLN
INTRAMUSCULAR | Status: DC | PRN
  Administered 2014-02-16: 1 mg via INTRAVENOUS

## 2014-02-16 MED ORDER — CEFAZOLIN SODIUM-DEXTROSE 2-3 GM-% IV SOLR
2.0000 g | INTRAVENOUS | Status: AC
  Administered 2014-02-16: 2 g via INTRAVENOUS

## 2014-02-16 MED ORDER — LIDOCAINE-EPINEPHRINE (PF) 2 %-1:200000 IJ SOLN
INTRAMUSCULAR | Status: AC
  Filled 2014-02-16: qty 20

## 2014-02-16 MED ORDER — SIMETHICONE 80 MG PO CHEW: 80.0000 mg | CHEWABLE_TABLET | ORAL | Status: DC | PRN

## 2014-02-16 MED ORDER — MAGNESIUM HYDROXIDE 400 MG/5ML PO SUSP
30.0000 mL | ORAL | Status: DC | PRN
  Filled 2014-02-16: qty 30

## 2014-02-16 MED ORDER — TETANUS-DIPHTH-ACELL PERTUSSIS 5-2.5-18.5 LF-MCG/0.5 IM SUSP
0.5000 mL | Freq: Once | INTRAMUSCULAR | Status: DC
  Filled 2014-02-16: qty 0.5

## 2014-02-16 MED ORDER — ONDANSETRON HCL 4 MG/2ML IJ SOLN: 4.0000 mg | Freq: Three times a day (TID) | INTRAMUSCULAR | Status: DC | PRN

## 2014-02-16 MED ORDER — LACTATED RINGERS IV SOLN
40.0000 [IU] | INTRAVENOUS | Status: DC | PRN
  Administered 2014-02-16: 40 [IU] via INTRAVENOUS

## 2014-02-16 MED ORDER — IBUPROFEN 600 MG PO TABS
600.0000 mg | ORAL_TABLET | Freq: Four times a day (QID) | ORAL | Status: DC
  Administered 2014-02-17 – 2014-02-19 (×8): 600 mg via ORAL
  Filled 2014-02-16 (×8): qty 1

## 2014-02-16 MED ORDER — SODIUM BICARBONATE 8.4 % IV SOLN
INTRAVENOUS | Status: DC | PRN
  Administered 2014-02-16: 10 mL via EPIDURAL
  Administered 2014-02-16: 2 mL via EPIDURAL
  Administered 2014-02-16: 3 mL via EPIDURAL

## 2014-02-16 MED ORDER — MAGNESIUM SULFATE 40 G IN LACTATED RINGERS - SIMPLE
2.0000 g/h | INTRAVENOUS | Status: DC
  Administered 2014-02-17: 2 g/h via INTRAVENOUS
  Filled 2014-02-16 (×2): qty 500

## 2014-02-16 MED ORDER — OXYTOCIN 40 UNITS IN LACTATED RINGERS INFUSION - SIMPLE MED
1.0000 m[IU]/min | INTRAVENOUS | Status: DC
  Administered 2014-02-16: 2 m[IU]/min via INTRAVENOUS
  Filled 2014-02-16: qty 1000

## 2014-02-16 MED ORDER — LABETALOL HCL 5 MG/ML IV SOLN
20.0000 mg | Freq: Once | INTRAVENOUS | Status: AC
  Administered 2014-02-16: 20 mg via INTRAVENOUS
  Filled 2014-02-16: qty 4

## 2014-02-16 MED ORDER — NALOXONE HCL 1 MG/ML IJ SOLN
1.0000 ug/kg/h | INTRAMUSCULAR | Status: DC | PRN
  Filled 2014-02-16: qty 2

## 2014-02-16 MED ORDER — KETOROLAC TROMETHAMINE 60 MG/2ML IM SOLN
60.0000 mg | Freq: Once | INTRAMUSCULAR | Status: AC | PRN
  Administered 2014-02-16: 60 mg via INTRAMUSCULAR

## 2014-02-16 MED ORDER — OXYTOCIN 10 UNIT/ML IJ SOLN
INTRAMUSCULAR | Status: AC
  Filled 2014-02-16: qty 4

## 2014-02-16 SURGICAL SUPPLY — 32 items
BENZOIN TINCTURE PRP APPL 2/3 (GAUZE/BANDAGES/DRESSINGS) ×2 IMPLANT
CLAMP CORD UMBIL (MISCELLANEOUS) IMPLANT
CLOTH BEACON ORANGE TIMEOUT ST (SAFETY) ×2 IMPLANT
CONTAINER PREFILL 10% NBF 15ML (MISCELLANEOUS) IMPLANT
DERMABOND ADVANCED (GAUZE/BANDAGES/DRESSINGS)
DERMABOND ADVANCED .7 DNX12 (GAUZE/BANDAGES/DRESSINGS) IMPLANT
DRAPE LG THREE QUARTER DISP (DRAPES) IMPLANT
DRSG OPSITE POSTOP 4X10 (GAUZE/BANDAGES/DRESSINGS) ×2 IMPLANT
DURAPREP 26ML APPLICATOR (WOUND CARE) ×2 IMPLANT
ELECT REM PT RETURN 9FT ADLT (ELECTROSURGICAL) ×2
ELECTRODE REM PT RTRN 9FT ADLT (ELECTROSURGICAL) ×1 IMPLANT
EXTRACTOR VACUUM M CUP 4 TUBE (SUCTIONS) IMPLANT
GLOVE BIO SURGEON STRL SZ8 (GLOVE) ×2 IMPLANT
GOWN STRL REUS W/TWL LRG LVL3 (GOWN DISPOSABLE) ×4 IMPLANT
KIT ABG SYR 3ML LUER SLIP (SYRINGE) ×2 IMPLANT
NEEDLE HYPO 21X1.5 SAFETY (NEEDLE) ×2 IMPLANT
NEEDLE HYPO 25X5/8 SAFETYGLIDE (NEEDLE) ×2 IMPLANT
NS IRRIG 1000ML POUR BTL (IV SOLUTION) ×2 IMPLANT
PACK C SECTION WH (CUSTOM PROCEDURE TRAY) ×2 IMPLANT
PAD OB MATERNITY 4.3X12.25 (PERSONAL CARE ITEMS) ×2 IMPLANT
RETAINER VISCERAL (MISCELLANEOUS) ×2 IMPLANT
STAPLER VISISTAT 35W (STAPLE) IMPLANT
STRIP CLOSURE SKIN 1/2X4 (GAUZE/BANDAGES/DRESSINGS) ×2 IMPLANT
SUT MNCRL 0 VIOLET CTX 36 (SUTURE) ×4 IMPLANT
SUT MONOCRYL 0 CTX 36 (SUTURE) ×4
SUT PDS AB 0 CTX 60 (SUTURE) ×2 IMPLANT
SUT PLAIN 0 NONE (SUTURE) IMPLANT
SUT VIC AB 4-0 KS 27 (SUTURE) ×2 IMPLANT
SYR 20CC LL (SYRINGE) ×2 IMPLANT
TOWEL OR 17X24 6PK STRL BLUE (TOWEL DISPOSABLE) ×2 IMPLANT
TRAY FOLEY CATH 14FR (SET/KITS/TRAYS/PACK) ×2 IMPLANT
WATER STERILE IRR 1000ML POUR (IV SOLUTION) ×2 IMPLANT

## 2014-02-16 NOTE — Progress Notes (Signed)
FHT reactive Cx no change AROM clear IUPC placed-no signal , will replace cord Pitocin on

## 2014-02-16 NOTE — Progress Notes (Signed)
Cx 4/C/-2 FHT reactive MVU about 200-210 BPs stable

## 2014-02-16 NOTE — Anesthesia Procedure Notes (Signed)
Epidural Patient location during procedure: OB  Staffing Anesthesiologist: Phillips GroutARIGNAN, Jaysten Essner Performed by: anesthesiologist   Preanesthetic Checklist Completed: patient identified, site marked, surgical consent, pre-op evaluation, timeout performed, IV checked, risks and benefits discussed and monitors and equipment checked  Epidural Patient position: sitting Prep: ChloraPrep Patient monitoring: heart rate, continuous pulse ox and blood pressure Approach: right paramedian Location: L4-L5 Injection technique: LOR saline  Needle:  Needle type: Tuohy  Needle gauge: 17 G Needle length: 9 cm and 9 Needle insertion depth: 6 cm Catheter type: closed end flexible Catheter size: 20 Guage Catheter at skin depth: 10 cm Test dose: negative  Assessment Events: blood not aspirated, injection not painful, no injection resistance, negative IV test and no paresthesia  Additional Notes   Patient tolerated the insertion well without complications.

## 2014-02-16 NOTE — Progress Notes (Signed)
Cx 5 with UC/C/-2 FHT reacitve  UCs q2-3

## 2014-02-16 NOTE — Op Note (Signed)
Carrie Marsh:  [place], Peri              ACCOUNT NO.:  [id number]  MEDICAL RECORD NO.:  [id number]  LOCATION:  9372                          FACILITY:  [facility]  PHYSICIAN:  Guy SandiferJames E. Henderson Cloudomblin, M.D. DATE OF BIRTH:  [birth year]  DATE OF PROCEDURE:  02/16/2014 DATE OF DISCHARGE:                              OPERATIVE REPORT   PREOPERATIVE DIAGNOSES: 1. Preeclampsia. 2. Arrest of dilation.  POSTOPERATIVE DIAGNOSES: 1. Preeclampsia. 2. Arrest of dilation.  PROCEDURE:  Primary low transverse cesarean section.  SURGEON:  Guy SandiferJames E. Henderson Cloudomblin, M.D.  ANESTHESIA:  Epidural.  ESTIMATED BLOOD LOSS:  800 mL.  FINDINGS:  Viable female infant.  Apgars, arterial cord, pH, and weight pending.  SPECIMENS:  Placenta to pathology.  INDICATIONS AND CONSENTS:  This patient is a 28 year old married white female, admitted yesterday with preeclampsia.  She underwent 2-stage induction.  She progresses to 5 cm dilation, complete effacement, -2 station.  With adequate labor, she does not change after at least 3 hours of observation.  Diagnosis of arrest of dilation was made. Recommendation for cesarean section was made.  Potential risks and complications were discussed with the patient including not limited to infection, organ damage, bleeding requiring transfusion of blood products with HIV and hepatitis acquisition, DVT, PE, pneumonia.  All questions were answered and consent is signed on the chart.  PROCEDURE IN DETAIL:  The patient was taken to the operating room, where she is identified, and epidural anesthetic was augmented to a surgical level.  She is placed in dorsal supine position with a 15 degree left lateral wedge.  She is prepped and draped and Foley catheter was [facility] protocol.  Time-out undertaken.  After testing for adequate epidural anesthesia, skin was entered through a Pfannenstiel incision and dissection carried out in layers of the peritoneum. Peritoneum was incised and  extended superiorly and inferiorly. Vesicouterine peritoneum was taken down cephalad laterally.  The bladder flap was developed.  The bladder blade was placed.  Uterus was incised in a low transverse manner.  The uterine cavity was entered bluntly with a hemostat.  Uterine incision was extended cephalad laterally with fingers.  Vertex was delivered without difficulty and the baby was delivered.  Good cry and tone was noted.  Cord was clamped and cut, and the baby is handed to awaiting pediatrics team.  Placenta was manually delivered and sent to pathology.  Uterine cavity was clean.  Uterus was closed in 2 running locking imbricating layers of 0 Monocryl suture, which achieves good hemostasis.  Anterior peritoneum was closed in a running fashion with 0 Monocryl suture, which was also used to reapproximate the pyramidalis muscle in midline.  Anterior rectus fascia was closed in a running fashion with a 0 looped PDS suture.  A 20 mL of Exparel and 20 mL of normal saline was then injected both subfascially and subcutaneously.  The subcutaneous tissue was reapproximated with interrupted 2-0 plain suture and the skin was closed with a subcuticular Vicryl on a Keith needle.  Steri-Strips were applied.  Dressings were applied.  All counts were correct.  The patient was taken to the recovery room in stable condition.     Guy SandiferJames E. Henderson Cloudomblin, M.D.  JET/MEDQ  D:  02/16/2014  T:  02/16/2014  Job:  [id number]

## 2014-02-16 NOTE — Progress Notes (Signed)
Cx 3/80/-2 FHT reactive UCs q2-3 min Labs just drawn Will begin pitocin augmentation if UCs space out D/W patient

## 2014-02-16 NOTE — Brief Op Note (Signed)
02/15/2014 - 02/16/2014  7:30 PM  PATIENT:  Carrie Marsh  28 y.o. female  PRE-OPERATIVE DIAGNOSIS:  preeclampsia, failure to progress  POST-OPERATIVE DIAGNOSIS:  preeclampsia, failure to progress  PROCEDURE:  Procedure(s): CESAREAN SECTION (N/A)  SURGEON:  Surgeon(s) and Role:    * Roselle LocusJames E Dasiah Hooley II, MD - Primary  PHYSICIAN ASSISTANT:   ASSISTANTS: none   ANESTHESIA:   epidural  EBL:  Total I/O In: 1400 [I.V.:1400] Out: 750 [Urine:150; Blood:600]  BLOOD ADMINISTERED:none  DRAINS: Urinary Catheter (Foley)   LOCAL MEDICATIONS USED:  Amount: 20cc in 20 cc of saline ml and OTHER exparel  SPECIMEN:  Source of Specimen:  placenta  DISPOSITION OF SPECIMEN:  PATHOLOGY  COUNTS:  YES  TOURNIQUET:  * No tourniquets in log *  DICTATION: .Other Dictation: Dictation Number [id number]  PLAN OF CARE: Admit to inpatient   PATIENT DISPOSITION:  PACU - hemodynamically stable.   Delay start of Pharmacological VTE agent (>24hrs) due to surgical blood loss or risk of bleeding: not applicable

## 2014-02-16 NOTE — Progress Notes (Signed)
BP sys >160 sustained Will treat with labetalol 20mg  IV

## 2014-02-16 NOTE — Anesthesia Preprocedure Evaluation (Addendum)

## 2014-02-16 NOTE — Progress Notes (Signed)
Cx no change UCs q2-3 min FHT reactive  A: Arrest of dilation  P: Rec cesarean section. D/W risks including infection, organ damage, bleeding/transfusion-HIV/Hep, DVT/PE, pneumonia, return to OR. D/W PP magnesium sulfate. All questions answered.

## 2014-02-16 NOTE — Transfer of Care (Signed)
Immediate Anesthesia Transfer of Care Note  Patient: Carrie Marsh  Procedure(s) Performed: Procedure(s): CESAREAN SECTION (N/A)  Patient Location: PACU  Anesthesia Type:Epidural  Level of Consciousness: awake  Airway & Oxygen Therapy: Patient Spontanous Breathing  Post-op Assessment: Report given to PACU RN and Post -op Vital signs reviewed and stable  Post vital signs: stable  Complications: No apparent anesthesia complications

## 2014-02-17 LAB — COMPREHENSIVE METABOLIC PANEL
ALBUMIN: 1.8 g/dL — AB (ref 3.5–5.2)
ALT: 9 U/L (ref 0–35)
ALT: 8 U/L (ref 0–35)
AST: 19 U/L (ref 0–37)
AST: 17 U/L (ref 0–37)
Albumin: 1.9 g/dL — ABNORMAL LOW (ref 3.5–5.2)
Alkaline Phosphatase: 137 U/L — ABNORMAL HIGH (ref 39–117)
Alkaline Phosphatase: 122 U/L — ABNORMAL HIGH (ref 39–117)
BUN: 3 mg/dL — ABNORMAL LOW (ref 6–23)
BUN: 3 mg/dL — ABNORMAL LOW (ref 6–23)
CALCIUM: 7 mg/dL — AB (ref 8.4–10.5)
CALCIUM: 6.8 mg/dL — AB (ref 8.4–10.5)
CO2: 27 meq/L (ref 19–32)
CO2: 26 meq/L (ref 19–32)
Chloride: 104 mEq/L (ref 96–112)
Chloride: 101 mEq/L (ref 96–112)
Creatinine, Ser: 0.6 mg/dL (ref 0.50–1.10)
Creatinine, Ser: 0.55 mg/dL (ref 0.50–1.10)
GFR calc Af Amer: >90 mL/min (ref >90)
GLUCOSE: 118 mg/dL — AB (ref 70–99)
Glucose, Bld: 103 mg/dL — ABNORMAL HIGH (ref 70–99)
Potassium: 3.4 mEq/L — ABNORMAL LOW (ref 3.7–5.3)
Potassium: 3 mEq/L — ABNORMAL LOW (ref 3.7–5.3)
Sodium: 141 mEq/L (ref 137–147)
Sodium: 138 mEq/L (ref 137–147)
TOTAL PROTEIN: 4.4 g/dL — AB (ref 6.0–8.3)
Total Bilirubin: 0.2 mg/dL — ABNORMAL LOW (ref 0.3–1.2)
Total Bilirubin: <0.2 mg/dL — ABNORMAL LOW (ref 0.3–1.2)
Total Protein: 4.2 g/dL — ABNORMAL LOW (ref 6.0–8.3)

## 2014-02-17 LAB — CBC
HCT: 26.5 % — ABNORMAL LOW (ref 36.0–46.0)
HCT: 24.4 % — ABNORMAL LOW (ref 36.0–46.0)
HEMOGLOBIN: 9.2 g/dL — AB (ref 12.0–15.0)
Hemoglobin: 8.5 g/dL — ABNORMAL LOW (ref 12.0–15.0)
MCH: 31.4 pg (ref 26.0–34.0)
MCH: 31.5 pg (ref 26.0–34.0)
MCHC: 34.7 g/dL (ref 30.0–36.0)
MCHC: 34.8 g/dL (ref 30.0–36.0)
MCV: 90.4 fL (ref 78.0–100.0)
MCV: 90.4 fL (ref 78.0–100.0)
PLATELETS: 195 10*3/uL (ref 150–400)
PLATELETS: 203 10*3/uL (ref 150–400)
RBC: 2.93 MIL/uL — ABNORMAL LOW (ref 3.87–5.11)
RBC: 2.7 MIL/uL — ABNORMAL LOW (ref 3.87–5.11)
RDW: 14.2 % (ref 11.5–15.5)
RDW: 14.3 % (ref 11.5–15.5)
WBC: 14.6 10*3/uL — ABNORMAL HIGH (ref 4.0–10.5)
WBC: 14 10*3/uL — ABNORMAL HIGH (ref 4.0–10.5)

## 2014-02-17 LAB — URIC ACID: Uric Acid, Serum: 4.4 mg/dL (ref 2.4–7.0)

## 2014-02-17 MED ORDER — PRENATAL MULTIVITAMIN CH
1.0000 | ORAL_TABLET | Freq: Every day | ORAL | Status: DC
  Administered 2014-02-18 (×2): 1 via ORAL
  Filled 2014-02-17 (×2): qty 1

## 2014-02-17 MED ORDER — RHO D IMMUNE GLOBULIN 1500 UNIT/2ML IJ SOLN
300.0000 ug | Freq: Once | INTRAMUSCULAR | Status: AC
  Administered 2014-02-17: 300 ug via INTRAMUSCULAR
  Filled 2014-02-17: qty 2

## 2014-02-17 MED ORDER — PANTOPRAZOLE SODIUM 40 MG PO TBEC
40.0000 mg | DELAYED_RELEASE_TABLET | Freq: Every day | ORAL | Status: DC
  Administered 2014-02-17 – 2014-02-18 (×2): 40 mg via ORAL
  Filled 2014-02-17 (×2): qty 1

## 2014-02-17 MED ORDER — FUROSEMIDE 10 MG/ML IJ SOLN
10.0000 mg | INTRAMUSCULAR | Status: AC
  Administered 2014-02-17: 10 mg via INTRAVENOUS
  Filled 2014-02-17: qty 2

## 2014-02-17 NOTE — Anesthesia Postprocedure Evaluation (Signed)
  Anesthesia Post-op Note  Patient: Carrie Marsh  Procedure(s) Performed: Procedure(s) (LRB): CESAREAN SECTION (N/A)  Patient Location: PACU  Anesthesia Type: Epidural  Level of Consciousness: awake and alert   Airway and Oxygen Therapy: Patient Spontanous Breathing  Post-op Pain: mild  Post-op Assessment: Post-op Vital signs reviewed, Patient's Cardiovascular Status Stable, Respiratory Function Stable, Patent Airway and No signs of Nausea or vomiting  Last Vitals:  Filed Vitals:   02/17/14 0615  BP: 151/88  Pulse: 79  Temp:   Resp: 20    Post-op Vital Signs: stable   Complications: No apparent anesthesia complications

## 2014-02-17 NOTE — Progress Notes (Signed)
POD #1  Feels "fuzzy" No SOB, good pain relief  Filed Vitals:   02/17/14 0900  BP: 158/84  Pulse:   Temp:   Resp:     UO about 50-100cc/hr  Lungs CTA Cor RRR Abd mild distension, BS+ Incision healing well DTR 2+  Results for orders placed during the hospital encounter of 02/15/14 (from the past 24 hour(s))  CBC     Status: Abnormal   Collection Time    02/16/14  3:45 PM      Result Value Ref Range   WBC 16.5 (*) 4.0 - 10.5 K/uL   RBC 4.40  3.87 - 5.11 MIL/uL   Hemoglobin 14.0  12.0 - 15.0 g/dL   HCT 16.139.5  09.636.0 - 04.546.0 %   MCV 89.8  78.0 - 100.0 fL   MCH 31.8  26.0 - 34.0 pg   MCHC 35.4  30.0 - 36.0 g/dL   RDW 40.913.8  81.111.5 - 91.415.5 %   Platelets 200  150 - 400 K/uL  COMPREHENSIVE METABOLIC PANEL     Status: Abnormal   Collection Time    02/16/14  3:45 PM      Result Value Ref Range   Sodium 143  137 - 147 mEq/L   Potassium 3.0 (*) 3.7 - 5.3 mEq/L   Chloride 105  96 - 112 mEq/L   CO2 22  19 - 32 mEq/L   Glucose, Bld 78  70 - 99 mg/dL   BUN 3 (*) 6 - 23 mg/dL   Creatinine, Ser 7.820.49 (*) 0.50 - 1.10 mg/dL   Calcium 7.8 (*) 8.4 - 10.5 mg/dL   Total Protein 5.9 (*) 6.0 - 8.3 g/dL   Albumin 2.6 (*) 3.5 - 5.2 g/dL   AST 21  0 - 37 U/L   ALT 12  0 - 35 U/L   Alkaline Phosphatase 207 (*) 39 - 117 U/L   Total Bilirubin 0.4  0.3 - 1.2 mg/dL   GFR calc non Af Amer >90  >90 mL/min   GFR calc Af Amer >90  >90 mL/min  URIC ACID     Status: None   Collection Time    02/16/14  3:45 PM      Result Value Ref Range   Uric Acid, Serum 3.7  2.4 - 7.0 mg/dL  CBC     Status: Abnormal   Collection Time    02/17/14  5:34 AM      Result Value Ref Range   WBC 14.6 (*) 4.0 - 10.5 K/uL   RBC 2.93 (*) 3.87 - 5.11 MIL/uL   Hemoglobin 9.2 (*) 12.0 - 15.0 g/dL   HCT 95.626.5 (*) 21.336.0 - 08.646.0 %   MCV 90.4  78.0 - 100.0 fL   MCH 31.4  26.0 - 34.0 pg   MCHC 34.7  30.0 - 36.0 g/dL   RDW 57.814.2  46.911.5 - 62.915.5 %   Platelets 195  150 - 400 K/uL  COMPREHENSIVE METABOLIC PANEL     Status: Abnormal   Collection Time    02/17/14  5:34 AM      Result Value Ref Range   Sodium 141  137 - 147 mEq/L   Potassium 3.4 (*) 3.7 - 5.3 mEq/L   Chloride 104  96 - 112 mEq/L   CO2 27  19 - 32 mEq/L   Glucose, Bld 118 (*) 70 - 99 mg/dL   BUN 3 (*) 6 - 23 mg/dL   Creatinine, Ser 5.280.60  0.50 - 1.10 mg/dL  Calcium 7.0 (*) 8.4 - 10.5 mg/dL   Total Protein 4.4 (*) 6.0 - 8.3 g/dL   Albumin 1.9 (*) 3.5 - 5.2 g/dL   AST 19  0 - 37 U/L   ALT 9  0 - 35 U/L   Alkaline Phosphatase 137 (*) 39 - 117 U/L   Total Bilirubin 0.2 (*) 0.3 - 1.2 mg/dL   GFR calc non Af Amer >90  >90 mL/min   GFR calc Af Amer >90  >90 mL/min  URIC ACID     Status: None   Collection Time    02/17/14  5:34 AM      Result Value Ref Range   Uric Acid, Serum 4.4  2.4 - 7.0 mg/dL  RH IG WORKUP (INCLUDES ABO/RH)     Status: None   Collection Time    02/17/14  5:34 AM      Result Value Ref Range   Gestational Age(Wks) 37     ABO/RH(D) B NEG     Antibody Screen POS     Fetal Screen NEG     Antibody Identification PASSIVELY ACQUIRED ANTI-D     DAT, IgG NEG     Unit Number [id number]     Blood Component Type RHIG     Unit division 00     Status of Unit ALLOCATED     Transfusion Status OK TO TRANSFUSE      A: POD #1     Preeclampsia  P: Continue Magnesium sulfate      Labs in am      D/W patient

## 2014-02-17 NOTE — Anesthesia Postprocedure Evaluation (Signed)
  Anesthesia Post-op Note  Patient: Steva ColderHeather C Freiermuth  Procedure(s) Performed: Procedure(s): CESAREAN SECTION (N/A)  Patient Location: ICU  Anesthesia Type:Epidural  Level of Consciousness: awake, alert  and oriented  Airway and Oxygen Therapy: Patient Spontanous Breathing  Post-op Pain: none  Post-op Assessment: Post-op Vital signs reviewed, Patient's Cardiovascular Status Stable, Respiratory Function Stable, Pain level controlled, No headache, No backache, No residual numbness and No residual motor weakness  Post-op Vital Signs: Reviewed and stable  Last Vitals:  Filed Vitals:   02/17/14 0800  BP:   Pulse:   Temp: 36.8 C  Resp: 18    Complications: No apparent anesthesia complications

## 2014-02-17 NOTE — Addendum Note (Signed)
Addendum created 02/17/14 0916 by Shanon PayorSuzanne M Aroldo Galli, CRNA   Modules edited: Notes Section   Notes Section:  File: [id number]

## 2014-02-17 NOTE — Lactation Note (Signed)
This note was copied from the chart of Carrie Gwendalyn EgeHeather Settle. Lactation Consultation Note      Initial consult with this mom of a term baby, now 3117 hours old. Mom was on magnesium for 18 hours PTD. Mom still on magnesium , and very sleepy.Baby also very sleepy, mild cues, sleepy once skin to skin with mom. Mom very edematous, including her breast. Difficult to compress, so I tried a 20 nipple shield. I did some suck training with the baby to get him ot open wide enough for the baby to latch. He suckled a few times, but mostly  Slept. I hand expressed and cup fed the baby about 1.5 mls of colostrum, and advised mom to pump about every 3hours, until the baby begins to latch and feed. Mom knows to call for questions/concerns.  Patient Name: Carrie Marsh ZOXWR'UToday's Date: 02/17/2014 Reason for consult: Initial assessment   Maternal Data Formula Feeding for Exclusion: Yes Reason for exclusion: Admission to Intensive Care Unit (ICU) post-partum (mom on mag drip in AICU) Has patient been taught Hand Expression?: Yes Does the patient have breastfeeding experience prior to this delivery?: No  Feeding Feeding Type: Breast Milk Length of feed: 5 min  LATCH Score/Interventions Latch: Repeated attempts needed to sustain latch, nipple held in mouth throughout feeding, stimulation needed to elicit sucking reflex. (baby latched with 20 nipple shiled, but few suckles, mostly slept) Intervention(s): Skin to skin;Teach feeding cues;Waking techniques Intervention(s): Adjust position;Assist with latch;Breast massage;Breast compression  Audible Swallowing: None Intervention(s): Skin to skin;Hand expression  Type of Nipple: Inverted (mom very edematous, making it diffiult to compress breast for latch - 20 nipple shiled used with good fit - nipples slightly inverted) Intervention(s): Double electric pump Intervention(s): Double electric pump  Comfort (Breast/Nipple): Soft / non-tender     Hold (Positioning):  Assistance needed to correctly position infant at breast and maintain latch. Intervention(s): Breastfeeding basics reviewed;Support Pillows;Position options;Skin to skin  LATCH Score: 4  Lactation Tools Discussed/Used Tools: Pump Breast pump type: Double-Electric Breast Pump WIC Program: No Pump Review: Setup, frequency, and cleaning;Milk Storage;Other (comment) (hand expresion and cup feeding taught to mom ) Initiated by:: bedside RN Date initiated:: 02/17/14   Consult Status Consult Status: Follow-up Date: 02/18/14 Follow-up type: In-patient    Alfred LevinsChristine Anne Keyonda Bickle 02/17/2014, 12:08 PM

## 2014-02-18 ENCOUNTER — Encounter (HOSPITAL_COMMUNITY): Payer: Self-pay | Admitting: Obstetrics and Gynecology

## 2014-02-18 LAB — COMPREHENSIVE METABOLIC PANEL
ALT: 8 U/L (ref 0–35)
AST: 17 U/L (ref 0–37)
Albumin: 1.8 g/dL — ABNORMAL LOW (ref 3.5–5.2)
Alkaline Phosphatase: 119 U/L — ABNORMAL HIGH (ref 39–117)
BUN: 7 mg/dL (ref 6–23)
CHLORIDE: 103 meq/L (ref 96–112)
CO2: 27 meq/L (ref 19–32)
Calcium: 7.3 mg/dL — ABNORMAL LOW (ref 8.4–10.5)
Creatinine, Ser: 0.62 mg/dL (ref 0.50–1.10)
Glucose, Bld: 121 mg/dL — ABNORMAL HIGH (ref 70–99)
Potassium: 2.9 mEq/L — CL (ref 3.7–5.3)
SODIUM: 141 meq/L (ref 137–147)
Total Protein: 4.4 g/dL — ABNORMAL LOW (ref 6.0–8.3)

## 2014-02-18 LAB — CBC
HCT: 23.3 % — ABNORMAL LOW (ref 36.0–46.0)
Hemoglobin: 8.2 g/dL — ABNORMAL LOW (ref 12.0–15.0)
MCH: 32.3 pg (ref 26.0–34.0)
MCHC: 35.2 g/dL (ref 30.0–36.0)
MCV: 91.7 fL (ref 78.0–100.0)
Platelets: 196 10*3/uL (ref 150–400)
RBC: 2.54 MIL/uL — AB (ref 3.87–5.11)
RDW: 14.4 % (ref 11.5–15.5)
WBC: 13.5 10*3/uL — ABNORMAL HIGH (ref 4.0–10.5)

## 2014-02-18 LAB — RH IG WORKUP (INCLUDES ABO/RH)
ABO/RH(D): B NEG
Antibody Screen: POSITIVE
DAT, IGG: NEGATIVE
Fetal Screen: NEGATIVE
Gestational Age(Wks): 37
UNIT DIVISION: 0

## 2014-02-18 LAB — URIC ACID: Uric Acid, Serum: 4.5 mg/dL (ref 2.4–7.0)

## 2014-02-18 MED ORDER — POTASSIUM CHLORIDE CRYS ER 20 MEQ PO TBCR
20.0000 meq | EXTENDED_RELEASE_TABLET | Freq: Two times a day (BID) | ORAL | Status: DC
  Administered 2014-02-18 – 2014-02-19 (×3): 20 meq via ORAL
  Filled 2014-02-18 (×3): qty 1

## 2014-02-18 NOTE — Lactation Note (Signed)
This note was copied from the chart of Carrie Gwendalyn EgeHeather Vallier. Lactation Consultation Note: Called to assist mom with breast feeding. Baby was just circ'd but mom reports he was showing feeding cues. Reports that she is having trouble getting the NS on. Assisted with placement- reviewed with parents- small amount of formula placed in NS. Baby alert and sucklng for about 10 minutes. Dad states this is the best he has done. Baby off to sleep. Reviewed keeping the baby close to the breast throughout the feeding. Mom asking for DEBP- she had used one in AICU. Setup for mom. Mom pumping when I left room. No questions at present. To call for assist prn  Patient Name: Carrie Gwendalyn EgeHeather Streiff WUJWJ'XToday's Date: 02/18/2014 Reason for consult: Follow-up assessment   Maternal Data Formula Feeding for Exclusion: No Infant to breast within first hour of birth: No Breastfeeding delayed due to:: Maternal status Has patient been taught Hand Expression?: Yes Does the patient have breastfeeding experience prior to this delivery?: No  Feeding Feeding Type: Breast Fed Length of feed: 10 min  LATCH Score/Interventions Latch: Grasps breast easily, tongue down, lips flanged, rhythmical sucking.  Audible Swallowing: Spontaneous and intermittent (with formula in NS)  Type of Nipple: Flat  Comfort (Breast/Nipple): Soft / non-tender     Hold (Positioning): Assistance needed to correctly position infant at breast and maintain latch. Intervention(s): Breastfeeding basics reviewed;Support Pillows;Skin to skin  LATCH Score: 8  Lactation Tools Discussed/Used Tools: Nipple Shields Nipple shield size: 20 Breast pump type: Double-Electric Breast Pump WIC Program: No   Consult Status Consult Status: Follow-up Date: 02/19/14 Follow-up type: In-patient    Pamelia HoitDonna D Enedina Pair 02/18/2014, 11:36 AM

## 2014-02-18 NOTE — Progress Notes (Signed)
Subjective: Postpartum Day 2: Cesarean Delivery Patient reports incisional pain, tolerating PO, + flatus and no problems voiding.    Objective: Vital signs in last 24 hours: Temp:  [98.1 F (36.7 C)-98.6 F (37 C)] 98.5 F (36.9 C) (04/27 0600) Pulse Rate:  [67-92] 81 (04/27 0600) Resp:  [17-20] 20 (04/27 0600) BP: (133-158)/(61-86) 158/73 mmHg (04/27 0600) SpO2:  [93 %-98 %] 97 % (04/27 0600) Weight:  [183 lb 4.8 oz (83.144 kg)] 183 lb 4.8 oz (83.144 kg) (04/26 1030)  Physical Exam:  General: alert and cooperative Lochia: appropriate Uterine Fundus: firm Incision: honeycomb dressing CDI DVT Evaluation: No evidence of DVT seen on physical exam. Negative Homan's sign. No cords or calf tenderness. No significant calf/ankle edema.   Recent Labs  02/17/14 1607 02/18/14 0605  HGB 8.5* 8.2*  HCT 24.4* 23.3*    Assessment/Plan: Status post Cesarean section. Doing well postoperatively.  Continue current care.  Carrie Marsh 02/18/2014, 8:02 AM

## 2014-02-19 LAB — BASIC METABOLIC PANEL
BUN: 6 mg/dL (ref 6–23)
CHLORIDE: 105 meq/L (ref 96–112)
CO2: 27 mEq/L (ref 19–32)
Calcium: 7.6 mg/dL — ABNORMAL LOW (ref 8.4–10.5)
Creatinine, Ser: 0.51 mg/dL (ref 0.50–1.10)
GFR calc Af Amer: >90 mL/min (ref >90)
GFR calc non Af Amer: >90 mL/min (ref >90)
Glucose, Bld: 75 mg/dL (ref 70–99)
POTASSIUM: 3.1 meq/L — AB (ref 3.7–5.3)
Sodium: 143 mEq/L (ref 137–147)

## 2014-02-19 MED ORDER — OXYCODONE-ACETAMINOPHEN 5-325 MG PO TABS: 1.0000 | ORAL_TABLET | ORAL | Status: DC | PRN

## 2014-02-19 MED ORDER — POTASSIUM CHLORIDE CRYS ER 20 MEQ PO TBCR: 20.0000 meq | EXTENDED_RELEASE_TABLET | Freq: Two times a day (BID) | ORAL | Status: DC

## 2014-02-19 MED ORDER — IBUPROFEN 600 MG PO TABS: 600.0000 mg | ORAL_TABLET | Freq: Four times a day (QID) | ORAL | Status: DC

## 2014-02-19 MED ORDER — MEASLES, MUMPS & RUBELLA VAC ~~LOC~~ INJ
0.5000 mL | INJECTION | Freq: Once | SUBCUTANEOUS | Status: AC
  Administered 2014-02-19: 0.5 mL via SUBCUTANEOUS
  Filled 2014-02-19 (×2): qty 0.5

## 2014-02-19 NOTE — Lactation Note (Addendum)
This note was copied from the chart of Boy Gwendalyn EgeHeather Berrey. Lactation Consultation Note  Patient Name: Boy Gwendalyn EgeHeather Chipley ZOXWR'UToday's Date: 02/19/2014 Reason for consult: Follow-up assessment Follow-up assessment prior to discharge. Mom reports BF going well, mom has a DEBP at home, and has been using a #20 NS. Mom requested a second #20 and given a #24 for use at home as needed. Enc mom to continue to attempt to latch baby directly to the breast without NS, enc to hand pump prior to attempting to latch to evert nipple as much as possible and have EBM dripping. Enc mom to call back for an OP appointment to help with latching the baby to breast without NS, mom states she will call back when she knows her other appointments. Mom also enc to call for assistance as needed as LC can help over the phone. Mom enc to continue to post-pump for additional stimulation. Eng prevention/treatment discussed. Mom aware of OP/BFSG services. Baby asleep and mom did not want to wake baby to attempt to latch at this time.  Maternal Data    Feeding Feeding Type:  (Baby asleep, mom didn't want to latch at this time.)  [facility]                      Lactation Tools Discussed/Used Tools: Nipple Shields Nipple shield size: 20;24   Consult Status Consult Status: Complete    Sherlyn HayJennifer D Tajon Moring 02/19/2014, 9:19 AM

## 2014-02-19 NOTE — Discharge Summary (Signed)
Obstetric Discharge Summary Reason for Admission: induction of labor Prenatal Procedures: ultrasound Intrapartum Procedures: cesarean: low cervical, transverse Postpartum Procedures: none Complications-Operative and Postpartum: none Hemoglobin  Date Value Ref Range Status  02/18/2014 8.2* 12.0 - 15.0 g/dL Final     HCT  Date Value Ref Range Status  02/18/2014 23.3* 36.0 - 46.0 % Final    Physical Exam:  General: alert and cooperative Lochia: appropriate Uterine Fundus: firm Incision: honeycomb dressing CDI DVT Evaluation: No evidence of DVT seen on physical exam. Negative Homan's sign. No cords or calf tenderness. Calf/Ankle edema is present.  Discharge Diagnoses: Term Pregnancy-delivered  Discharge Information: Date: 02/19/2014 Activity: pelvic rest Diet: routine Medications: PNV, Ibuprofen, Percocet and potassium Condition: stable Instructions: refer to practice specific booklet Discharge to: home   Newborn Data: Live born female  Birth Weight: 6 lb 14.2 oz (3124 g) APGAR: 9, 9  Home with mother.  Carrie Marsh 02/19/2014, 8:04 AM

## 2014-02-20 ENCOUNTER — Ambulatory Visit (HOSPITAL_COMMUNITY)
Admission: RE | Admit: 2014-02-20 | Discharge: 2014-02-20 | Disposition: A | Discharge status: Home / Self Care | Payer: BC Managed Care – PPO | Source: Ambulatory Visit | Attending: Physician Assistant | Admitting: Physician Assistant

## 2014-02-20 NOTE — Lactation Note (Signed)
Adult Lactation Consultation Outpatient Visit Note  Loletta SpecterCorbin (baby) Herbert SetaHeather is here today because she and dad are concerned that Loletta SpecterCorbin is not eating enough.  She has been using a NS and also supplementing with expressed BM and formula.  He has had 2 voids in the past 24 hours.  Stools are appropriate.Today he was very sleepy and he had difficulty feeding at the breast.   It took several attempts at positioning to elicit suckling. He is very sensitive to proper body alignment.  He finally settled into a rhythmic pattern.  His total transfer was 10 ml which is not sufficient for his age (end of the 3rd day of life.)  They also reported that he had not eaten in 4 hours.  He has been regurgitating much of his feedings which may be from over filling of his stomach (2-3oz per feeding).  Mom has very full breasts.  She was able to pump off 50 ml.  Loletta SpecterCorbin was fed 40ml.  Feeding was difficult to initiated because he was so sleepy and not cuing.  Once he ate the first 20 ml he started to wake and root.  He regurgiated some of that so when he was fed the additional 20 ml we used paced feeding and "burped" him.  After this feeding he was very relaxed and fell asleep. Parents stated that this was the most relaxed they had seen him.    Plan:   Bottle feed expressed breast milk to Streeterorbin with cues 8-12 times a day using paced feeding but at least every 3 hours. Herbert SetaHeather will express every time he eats and soften the breasts Feed per volume guidelines: Day 4: 28-42 ml, day 5: 45-60, day 6: 60-90   Diaper count per taking care of baby and me Try NS once Loletta SpecterCorbin is awake and consistently eating  Lactation appointment Monday 02/25/2014 @ 1 pm.  Patient Name: Steva ColderHeather C Benzel Date of Birth: 11/30/1985 Gestational Age at Delivery: Unknown Type of Delivery: c-section  Breastfeeding History: See above note Frequency of Breastfeeding Length of Feeding:  Voids:  Stools:   Supplementing / Method: Pumping:  Type of Pump:  Frequency:  Volume:    Comments:    Consultation Evaluation:  Initial Feeding Assessment: Pre-feed [id number]:2876 Post-feed [id number]:2886 Amount Transferred:10 ml Comments:See above note   Total Breast milk Transferred this Visit: 10 Total Supplement Given: 40 but some of it was regurgitated.  Total transfer was 40 ml   Additional Interventions:   Follow-Up Ped Friday 02/22/2014 Lactation Monday 02/25/2014      Soyla DryerMaryann Shilo Philipson 02/20/2014, 3:22 PM

## 2014-02-21 ENCOUNTER — Telehealth (HOSPITAL_COMMUNITY): Payer: Self-pay | Admitting: Lactation Services

## 2014-02-21 NOTE — Telephone Encounter (Signed)
Call to Ms. Shammas to follow up about infant's progress with feedings since her Lactation appointment yesterday, February 20, 2014. Patient states her baby was doing much better, tolerating her breast milk in the bottle, and having "lots' of voids and stools, alert with feedings. She is only pumping and giving her milk by bottle at this time. She is expressing 35-40 ml and feeding baby every 3 hours and using hands on pumping and hand expression post pumping to increase volume. She reports her baby eats and then falls asleep. Mother reports her baby has pediatrician appointment with [facility] tomorrow, Feb 22, 2014. Instructed to call for any concerns. Mother and Baby have a LC follow up appointment next week.

## 2014-02-25 ENCOUNTER — Ambulatory Visit (HOSPITAL_COMMUNITY): Payer: BC Managed Care – PPO

## 2014-08-26 ENCOUNTER — Encounter (HOSPITAL_COMMUNITY): Payer: Self-pay | Admitting: Obstetrics and Gynecology

## 2015-03-18 ENCOUNTER — Other Ambulatory Visit: Payer: Self-pay | Admitting: Gastroenterology

## 2015-03-18 DIAGNOSIS — R1033 Periumbilical pain: Secondary | ICD-10-CM

## 2015-03-18 DIAGNOSIS — R110 Nausea: Secondary | ICD-10-CM

## 2015-03-18 DIAGNOSIS — R1032 Left lower quadrant pain: Secondary | ICD-10-CM

## 2015-03-26 ENCOUNTER — Ambulatory Visit (HOSPITAL_COMMUNITY)
Admission: RE | Admit: 2015-03-26 | Discharge: 2015-03-26 | Disposition: A | Discharge status: Home / Self Care | Payer: BLUE CROSS/BLUE SHIELD | Source: Ambulatory Visit | Attending: Gastroenterology | Admitting: Gastroenterology

## 2015-03-26 DIAGNOSIS — R1033 Periumbilical pain: Secondary | ICD-10-CM

## 2015-03-26 DIAGNOSIS — R110 Nausea: Secondary | ICD-10-CM

## 2015-03-26 DIAGNOSIS — R197 Diarrhea, unspecified: Secondary | ICD-10-CM | POA: Insufficient documentation

## 2015-03-26 DIAGNOSIS — R1032 Left lower quadrant pain: Secondary | ICD-10-CM | POA: Insufficient documentation

## 2015-03-26 MED ORDER — IOHEXOL 300 MG/ML  SOLN
80.0000 mL | Freq: Once | INTRAMUSCULAR | Status: AC | PRN
  Administered 2015-03-26: 100 mL via INTRAVENOUS

## 2015-04-20 ENCOUNTER — Emergency Department (HOSPITAL_COMMUNITY)
Admission: EM | Admit: 2015-04-20 | Discharge: 2015-04-20 | Disposition: A | Discharge status: Home / Self Care | Payer: BLUE CROSS/BLUE SHIELD | Attending: Emergency Medicine | Admitting: Emergency Medicine

## 2015-04-20 ENCOUNTER — Encounter (HOSPITAL_COMMUNITY): Payer: Self-pay | Admitting: Adult Health

## 2015-04-20 DIAGNOSIS — K589 Irritable bowel syndrome without diarrhea: Secondary | ICD-10-CM | POA: Diagnosis not present

## 2015-04-20 DIAGNOSIS — Z79899 Other long term (current) drug therapy: Secondary | ICD-10-CM | POA: Diagnosis not present

## 2015-04-20 DIAGNOSIS — Z3202 Encounter for pregnancy test, result negative: Secondary | ICD-10-CM | POA: Insufficient documentation

## 2015-04-20 DIAGNOSIS — R1030 Lower abdominal pain, unspecified: Secondary | ICD-10-CM

## 2015-04-20 LAB — CBC WITH DIFFERENTIAL/PLATELET
BASOS ABS: 0 10*3/uL (ref 0.0–0.1)
BASOS PCT: 0 % (ref 0–1)
EOS PCT: 3 % (ref 0–5)
Eosinophils Absolute: 0.2 10*3/uL (ref 0.0–0.7)
HCT: 38.1 % (ref 36.0–46.0)
Hemoglobin: 13.7 g/dL (ref 12.0–15.0)
Lymphocytes Relative: 31 % (ref 12–46)
Lymphs Abs: 2.1 10*3/uL (ref 0.7–4.0)
MCH: 30.4 pg (ref 26.0–34.0)
MCHC: 36 g/dL (ref 30.0–36.0)
MCV: 84.7 fL (ref 78.0–100.0)
MONO ABS: 0.5 10*3/uL (ref 0.1–1.0)
Monocytes Relative: 7 % (ref 3–12)
Neutro Abs: 3.9 10*3/uL (ref 1.7–7.7)
Neutrophils Relative %: 59 % (ref 43–77)
PLATELETS: 263 10*3/uL (ref 150–400)
RBC: 4.5 MIL/uL (ref 3.87–5.11)
RDW: 12.4 % (ref 11.5–15.5)
WBC: 6.7 10*3/uL (ref 4.0–10.5)

## 2015-04-20 LAB — COMPREHENSIVE METABOLIC PANEL
ALK PHOS: 79 U/L (ref 38–126)
ALT: 17 U/L (ref 14–54)
ANION GAP: 6 (ref 5–15)
AST: 17 U/L (ref 15–41)
Albumin: 3.3 g/dL — ABNORMAL LOW (ref 3.5–5.0)
BUN: 8 mg/dL (ref 6–20)
CALCIUM: 8.7 mg/dL — AB (ref 8.9–10.3)
CO2: 26 mmol/L (ref 22–32)
CREATININE: 0.85 mg/dL (ref 0.44–1.00)
Chloride: 107 mmol/L (ref 101–111)
GFR calc non Af Amer: >60 mL/min (ref >60)
Glucose, Bld: 101 mg/dL — ABNORMAL HIGH (ref 65–99)
Potassium: 3.4 mmol/L — ABNORMAL LOW (ref 3.5–5.1)
SODIUM: 139 mmol/L (ref 135–145)
Total Bilirubin: 0.3 mg/dL (ref 0.3–1.2)
Total Protein: 7 g/dL (ref 6.5–8.1)

## 2015-04-20 LAB — URINALYSIS, ROUTINE W REFLEX MICROSCOPIC
BILIRUBIN URINE: NEGATIVE
Glucose, UA: NEGATIVE mg/dL
Hgb urine dipstick: NEGATIVE
Ketones, ur: NEGATIVE mg/dL
Leukocytes, UA: NEGATIVE
Nitrite: NEGATIVE
PROTEIN: NEGATIVE mg/dL
Specific Gravity, Urine: 1.026 (ref 1.005–1.030)
Urobilinogen, UA: 0.2 mg/dL (ref 0.0–1.0)
pH: 6.5 (ref 5.0–8.0)

## 2015-04-20 LAB — PREGNANCY, URINE: Preg Test, Ur: NEGATIVE

## 2015-04-20 LAB — POC OCCULT BLOOD, ED: Fecal Occult Bld: POSITIVE — AB

## 2015-04-20 NOTE — ED Notes (Signed)
Presents with lower left quadrant abdominal pain-began this afternoon about an hour ago, reports two periods this month, denies vaginal discharge, denies urinary symptoms, denies nausea, vomiting. Endorses pain as constant "contraction" recently had a CT for the left side pain-was told she had IBS at that time.

## 2015-04-20 NOTE — Discharge Instructions (Signed)
Abdominal Pain Many things can cause abdominal pain. Usually, abdominal pain is not caused by a disease and will improve without treatment. It can often be observed and treated at home. Your health care provider will do a physical exam and possibly order blood tests and X-rays to help determine the seriousness of your pain. However, in many cases, more time must pass before a clear cause of the pain can be found. Before that point, your health care provider may not know if you need more testing or further treatment. HOME CARE INSTRUCTIONS  Monitor your abdominal pain for any changes. The following actions may help to alleviate any discomfort you are experiencing:  Only take over-the-counter or prescription medicines as directed by your health care provider.  Do not take laxatives unless directed to do so by your health care provider.  Try a clear liquid diet (broth, tea, or water) as directed by your health care provider. Slowly move to a bland diet as tolerated. SEEK MEDICAL CARE IF:  You have unexplained abdominal pain.  You have abdominal pain associated with nausea or diarrhea.  You have pain when you urinate or have a bowel movement.  You experience abdominal pain that wakes you in the night.  You have abdominal pain that is worsened or improved by eating food.  You have abdominal pain that is worsened with eating fatty foods.  You have a fever. SEEK IMMEDIATE MEDICAL CARE IF:   Your pain does not go away within 2 hours.  You keep throwing up (vomiting).  Your pain is felt only in portions of the abdomen, such as the right side or the left lower portion of the abdomen.  You pass bloody or black tarry stools. MAKE SURE YOU:  Understand these instructions.   Will watch your condition.   Will get help right away if you are not doing well or get worse.  Document Released: 07/21/2005 Document Revised: 10/16/2013 Document Reviewed: 06/20/2013 [facility] Patient Information  2015 [place], [place]. This information is not intended to replace advice given to you by your health care provider. Make sure you discuss any questions you have with your health care provider.  Please contact your primary care provider inform them of your visit today. Please share all relevant data. Please daily prescription for her IBS medication begin taking. Please follow-up with gastroenterologist. Please return to emergency room if new or worsening signs or symptoms present.

## 2015-04-20 NOTE — ED Notes (Signed)
Phlebotomy at the bedside  

## 2015-04-20 NOTE — ED Provider Notes (Signed)
CSN: [id number]     Arrival date & time 04/20/15  1301 History   First MD Initiated Contact with Patient 04/20/15 1330     Chief Complaint  Patient presents with  . Abdominal Pain   HPI   29 year old female with a history of IBS presents today with lower abdominal pain that began this afternoon. Patient reports that she's had identical presentation previously, was seen at the [facility] with full negative workup. Patient reports the pain traveled into her upper abdomen shortly after that, she was followed gastroenterology at that time and was diagnosed with IBS. Patient presents today with the return of the IBS related symptoms, identical to previous, reports that she was given an IBS medication but has not filled the prescription. Patient reports that she's had some very minor blood per rectum. She denies nausea or vomiting, fever, chills, focal abdominal tenderness. Trauma to the abdomen. Changes in urine color clarity or characteristics.  Past Medical History  Diagnosis Date  . GERD (gastroesophageal reflux disease)    Past Surgical History  Procedure Laterality Date  . Wisdom tooth extraction    . Cesarean section N/A 02/16/2014    Procedure: CESAREAN SECTION;  Surgeon: Leslie Andrea, MD;  Location: [facility];  Service: Obstetrics;  Laterality: N/A;   History reviewed. No pertinent family history. History  Substance Use Topics  . Smoking status: Passive Smoke Exposure - Never Smoker  . Smokeless tobacco: Never Used  . Alcohol Use: No   OB History    Gravida Para Term Preterm AB TAB SAB Ectopic Multiple Living   1 1 1       1      Review of Systems  All other systems reviewed and are negative.  Allergies  Review of patient's allergies indicates no known allergies.  Home Medications   Prior to Admission medications   Medication Sig Start Date End Date Taking? Authorizing Provider  acetaminophen (TYLENOL) 500 MG tablet Take 500 mg by mouth every 6 (six) hours as  needed for headache.    Historical Provider, MD  albuterol (PROAIR HFA) 108 (90 BASE) MCG/ACT inhaler Inhale 2 puffs into the lungs every 6 (six) hours as needed. For shortness of breath    Historical Provider, MD  ibuprofen (ADVIL,MOTRIN) 600 MG tablet Take 1 tablet (600 mg total) by mouth every 6 (six) hours. 02/19/14   Julio Sicks, NP  oxyCODONE-acetaminophen (PERCOCET/ROXICET) 5-325 MG per tablet Take 1-2 tablets by mouth every 4 (four) hours as needed for severe pain (moderate - severe pain). 02/19/14   Julio Sicks, NP  potassium chloride SA (K-DUR,KLOR-CON) 20 MEQ tablet Take 1 tablet (20 mEq total) by mouth 2 (two) times daily. 02/19/14   Julio Sicks, NP  Prenatal Vit-Fe Fumarate-FA (PRENATAL MULTIVITAMIN) TABS tablet Take 1 tablet by mouth daily at 12 noon.    Historical Provider, MD   BP 129/82 mmHg  Pulse 59  Temp(Src) 97.6 F (36.4 C) (Oral)  Resp 16  Ht 5\' 2"  (1.575 m)  SpO2 100%  LMP 04/14/2015 Physical Exam  Constitutional: She is oriented to person, place, and time. She appears well-developed and well-nourished.  HENT:  Head: Normocephalic and atraumatic.  Eyes: Conjunctivae are normal. Pupils are equal, round, and reactive to light. Right eye exhibits no discharge. Left eye exhibits no discharge. No scleral icterus.  Neck: Normal range of motion. No JVD present. No tracheal deviation present.  Cardiovascular: Normal rate and regular rhythm.   Pulmonary/Chest: Effort normal. No stridor.  Abdominal:  Soft. Bowel sounds are normal. She exhibits no distension.  Mildly tender to lower abdomen diffusely, no focal tenderness. No signs of trauma to the abdomen. Sounds normal.  Genitourinary:  No gross blood per rectum, no signs of fissures or hemorrhoids.  Neurological: She is alert and oriented to person, place, and time. Coordination normal.  Psychiatric: She has a normal mood and affect. Her behavior is normal. Judgment and thought content normal.  Nursing note and vitals  reviewed.   ED Course  Procedures (including critical care time) Labs Review Labs Reviewed  COMPREHENSIVE METABOLIC PANEL - Abnormal; Notable for the following:    Potassium 3.4 (*)    Glucose, Bld 101 (*)    Calcium 8.7 (*)    Albumin 3.3 (*)    All other components within normal limits  URINALYSIS, ROUTINE W REFLEX MICROSCOPIC (NOT AT [facility]) - Abnormal; Notable for the following:    APPearance HAZY (*)    All other components within normal limits  POC OCCULT BLOOD, ED - Abnormal; Notable for the following:    Fecal Occult Bld POSITIVE (*)    All other components within normal limits  CBC WITH DIFFERENTIAL/PLATELET  PREGNANCY, URINE  POC URINE PREG, ED    Imaging Review No results found.   EKG Interpretation None      MDM   Final diagnoses:  IBS (irritable bowel syndrome)  Lower abdominal pain   Labs: Point of care occult blood, urinalysis, pregnancy, CMP, CBC- POC positive, CMP potassium 3.4  Imaging: None indicated-recent CT abdomen on 03/26/2015 with no significant findings  Consults:  Therapeutics: Toradol, Zofran  Plan: Patient presents with recurrence of abdominal discomfort. She's had this multiple times in the past, has had OB/GYN follow-up with negative workup. She is seen a gastroenterologist for this he diagnosed her with IBS. Patient reports she has a prescription at home for anti-spasmodic, per reports she has not filled that. This presentation is identical to previous, recent CT abdomen with no significant findings, no indication for diagnostic imaging at this time. Patient instructed to initiate prescription medication given by her gastroenterologist, contact him and inform him of the visit today. Patient given strict return precautions event new worsening signs or symptoms present, she verbalizes understanding and agreement today's plan and had no further questions concerns at the time of discharge.      Eyvonne Mechanic, PA-C 04/24/15  1345  Gwyneth Sprout, MD 04/25/15 1547

## 2015-04-22 MED ORDER — ONDANSETRON HCL 4 MG PO TABS: 4.0000 mg | ORAL_TABLET | Freq: Once | ORAL | Status: AC

## 2015-04-22 MED ORDER — KETOROLAC TROMETHAMINE 60 MG/2ML IM SOLN: 60.0000 mg | Freq: Once | INTRAMUSCULAR | Status: AC

## 2016-01-27 DIAGNOSIS — M545 Low back pain: Secondary | ICD-10-CM | POA: Diagnosis not present

## 2016-02-04 DIAGNOSIS — M5416 Radiculopathy, lumbar region: Secondary | ICD-10-CM | POA: Diagnosis not present

## 2016-02-04 DIAGNOSIS — M545 Low back pain: Secondary | ICD-10-CM | POA: Diagnosis not present

## 2016-02-05 ENCOUNTER — Other Ambulatory Visit: Payer: Self-pay | Admitting: Student

## 2016-02-05 DIAGNOSIS — M545 Low back pain, unspecified: Secondary | ICD-10-CM

## 2016-02-10 DIAGNOSIS — M545 Low back pain: Secondary | ICD-10-CM | POA: Diagnosis not present

## 2016-02-16 DIAGNOSIS — M545 Low back pain: Secondary | ICD-10-CM | POA: Diagnosis not present

## 2016-02-26 ENCOUNTER — Ambulatory Visit
Admission: RE | Admit: 2016-02-26 | Discharge: 2016-02-26 | Disposition: A | Discharge status: Home / Self Care | Payer: BLUE CROSS/BLUE SHIELD | Source: Ambulatory Visit | Attending: Student | Admitting: Student

## 2016-02-26 DIAGNOSIS — M545 Low back pain, unspecified: Secondary | ICD-10-CM

## 2016-02-26 DIAGNOSIS — M25551 Pain in right hip: Secondary | ICD-10-CM | POA: Diagnosis not present

## 2016-03-15 DIAGNOSIS — M545 Low back pain: Secondary | ICD-10-CM | POA: Diagnosis not present

## 2016-06-09 DIAGNOSIS — Z01419 Encounter for gynecological examination (general) (routine) without abnormal findings: Secondary | ICD-10-CM | POA: Diagnosis not present

## 2016-06-09 DIAGNOSIS — Z6829 Body mass index (BMI) 29.0-29.9, adult: Secondary | ICD-10-CM | POA: Diagnosis not present

## 2016-06-09 DIAGNOSIS — Z1151 Encounter for screening for human papillomavirus (HPV): Secondary | ICD-10-CM | POA: Diagnosis not present

## 2016-07-02 DIAGNOSIS — Z3201 Encounter for pregnancy test, result positive: Secondary | ICD-10-CM | POA: Diagnosis not present

## 2016-07-29 DIAGNOSIS — Z3201 Encounter for pregnancy test, result positive: Secondary | ICD-10-CM | POA: Diagnosis not present

## 2016-08-18 DIAGNOSIS — Z113 Encounter for screening for infections with a predominantly sexual mode of transmission: Secondary | ICD-10-CM | POA: Diagnosis not present

## 2016-08-18 DIAGNOSIS — Z3481 Encounter for supervision of other normal pregnancy, first trimester: Secondary | ICD-10-CM | POA: Diagnosis not present

## 2016-08-18 DIAGNOSIS — Z3689 Encounter for other specified antenatal screening: Secondary | ICD-10-CM | POA: Diagnosis not present

## 2016-08-18 LAB — OB RESULTS CONSOLE GC/CHLAMYDIA
CHLAMYDIA, DNA PROBE: NEGATIVE
Gonorrhea: NEGATIVE

## 2016-08-18 LAB — OB RESULTS CONSOLE ABO/RH: RH Type: NEGATIVE

## 2016-08-18 LAB — OB RESULTS CONSOLE RPR: RPR: NONREACTIVE

## 2016-08-18 LAB — OB RESULTS CONSOLE RUBELLA ANTIBODY, IGM: RUBELLA: IMMUNE

## 2016-08-18 LAB — OB RESULTS CONSOLE HIV ANTIBODY (ROUTINE TESTING): HIV: NONREACTIVE

## 2016-08-18 LAB — OB RESULTS CONSOLE HEPATITIS B SURFACE ANTIGEN: HEP B S AG: NEGATIVE

## 2016-08-18 LAB — OB RESULTS CONSOLE ANTIBODY SCREEN: ANTIBODY SCREEN: NEGATIVE

## 2016-08-26 DIAGNOSIS — Z36 Encounter for antenatal screening for chromosomal anomalies: Secondary | ICD-10-CM | POA: Diagnosis not present

## 2016-08-26 DIAGNOSIS — Z3491 Encounter for supervision of normal pregnancy, unspecified, first trimester: Secondary | ICD-10-CM | POA: Diagnosis not present

## 2016-08-26 DIAGNOSIS — Z3682 Encounter for antenatal screening for nuchal translucency: Secondary | ICD-10-CM | POA: Diagnosis not present

## 2016-09-22 DIAGNOSIS — Z361 Encounter for antenatal screening for raised alphafetoprotein level: Secondary | ICD-10-CM | POA: Diagnosis not present

## 2016-10-14 DIAGNOSIS — Z363 Encounter for antenatal screening for malformations: Secondary | ICD-10-CM | POA: Diagnosis not present

## 2016-10-25 DIAGNOSIS — F32A Depression, unspecified: Secondary | ICD-10-CM

## 2016-10-25 HISTORY — DX: Depression, unspecified: F32.A

## 2016-12-02 DIAGNOSIS — Z23 Encounter for immunization: Secondary | ICD-10-CM | POA: Diagnosis not present

## 2016-12-23 DIAGNOSIS — Z23 Encounter for immunization: Secondary | ICD-10-CM | POA: Diagnosis not present

## 2016-12-23 DIAGNOSIS — Z3A29 29 weeks gestation of pregnancy: Secondary | ICD-10-CM | POA: Diagnosis not present

## 2016-12-23 DIAGNOSIS — O360110 Maternal care for anti-D [Rh] antibodies, first trimester, not applicable or unspecified: Secondary | ICD-10-CM | POA: Diagnosis not present

## 2016-12-23 DIAGNOSIS — Z3689 Encounter for other specified antenatal screening: Secondary | ICD-10-CM | POA: Diagnosis not present

## 2016-12-23 DIAGNOSIS — O4443 Low lying placenta NOS or without hemorrhage, third trimester: Secondary | ICD-10-CM | POA: Diagnosis not present

## 2017-01-04 ENCOUNTER — Ambulatory Visit (HOSPITAL_COMMUNITY)
Admission: RE | Admit: 2017-01-04 | Discharge: 2017-01-04 | Disposition: A | Discharge status: Home / Self Care | Payer: BLUE CROSS/BLUE SHIELD | Source: Ambulatory Visit | Attending: Obstetrics & Gynecology | Admitting: Obstetrics & Gynecology

## 2017-01-04 ENCOUNTER — Other Ambulatory Visit (HOSPITAL_COMMUNITY): Payer: Self-pay | Admitting: Obstetrics & Gynecology

## 2017-01-04 ENCOUNTER — Encounter (HOSPITAL_COMMUNITY): Payer: Self-pay | Admitting: *Deleted

## 2017-01-04 ENCOUNTER — Inpatient Hospital Stay (HOSPITAL_COMMUNITY)
Admission: AD | Admit: 2017-01-04 | Discharge: 2017-01-04 | Disposition: A | Discharge status: Home / Self Care | Payer: BLUE CROSS/BLUE SHIELD | Source: Ambulatory Visit | Attending: Obstetrics & Gynecology | Admitting: Obstetrics & Gynecology

## 2017-01-04 DIAGNOSIS — R110 Nausea: Secondary | ICD-10-CM | POA: Insufficient documentation

## 2017-01-04 DIAGNOSIS — O26893 Other specified pregnancy related conditions, third trimester: Secondary | ICD-10-CM | POA: Insufficient documentation

## 2017-01-04 DIAGNOSIS — Z7722 Contact with and (suspected) exposure to environmental tobacco smoke (acute) (chronic): Secondary | ICD-10-CM | POA: Diagnosis not present

## 2017-01-04 DIAGNOSIS — N2889 Other specified disorders of kidney and ureter: Secondary | ICD-10-CM | POA: Insufficient documentation

## 2017-01-04 DIAGNOSIS — R824 Acetonuria: Secondary | ICD-10-CM

## 2017-01-04 DIAGNOSIS — K219 Gastro-esophageal reflux disease without esophagitis: Secondary | ICD-10-CM | POA: Insufficient documentation

## 2017-01-04 DIAGNOSIS — N1330 Unspecified hydronephrosis: Secondary | ICD-10-CM | POA: Insufficient documentation

## 2017-01-04 DIAGNOSIS — R339 Retention of urine, unspecified: Secondary | ICD-10-CM | POA: Diagnosis not present

## 2017-01-04 DIAGNOSIS — O99613 Diseases of the digestive system complicating pregnancy, third trimester: Secondary | ICD-10-CM | POA: Diagnosis not present

## 2017-01-04 DIAGNOSIS — R109 Unspecified abdominal pain: Secondary | ICD-10-CM | POA: Diagnosis not present

## 2017-01-04 DIAGNOSIS — R34 Anuria and oliguria: Secondary | ICD-10-CM | POA: Diagnosis not present

## 2017-01-04 DIAGNOSIS — Z8759 Personal history of other complications of pregnancy, childbirth and the puerperium: Secondary | ICD-10-CM

## 2017-01-04 DIAGNOSIS — Z3A31 31 weeks gestation of pregnancy: Secondary | ICD-10-CM | POA: Insufficient documentation

## 2017-01-04 DIAGNOSIS — Z9889 Other specified postprocedural states: Secondary | ICD-10-CM | POA: Diagnosis not present

## 2017-01-04 DIAGNOSIS — E860 Dehydration: Secondary | ICD-10-CM | POA: Diagnosis present

## 2017-01-04 HISTORY — DX: Gestational (pregnancy-induced) hypertension without significant proteinuria, unspecified trimester: O13.9

## 2017-01-04 HISTORY — DX: Dehydration: E86.0

## 2017-01-04 HISTORY — DX: Personal history of other complications of pregnancy, childbirth and the puerperium: Z87.59

## 2017-01-04 HISTORY — DX: Acetonuria: R82.4

## 2017-01-04 HISTORY — DX: Anuria and oliguria: R34

## 2017-01-04 LAB — KETONES, URINE: Ketones, ur: 20 mg/dL — AB

## 2017-01-04 MED ORDER — LACTATED RINGERS IV SOLN
Freq: Once | INTRAVENOUS | Status: AC
  Administered 2017-01-04: 16:00:00 via INTRAVENOUS

## 2017-01-04 MED ORDER — DEXTROSE 5 % IN LACTATED RINGERS IV BOLUS
1000.0000 mL | Freq: Once | INTRAVENOUS | Status: AC
  Administered 2017-01-04: 1000 mL via INTRAVENOUS

## 2017-01-04 MED ORDER — PROMETHAZINE HCL 25 MG/ML IJ SOLN
25.0000 mg | Freq: Once | INTRAMUSCULAR | Status: AC
  Administered 2017-01-04: 25 mg via INTRAVENOUS
  Filled 2017-01-04: qty 1

## 2017-01-04 NOTE — MAU Provider Note (Signed)
  History     CSN: [id number]  Arrival date and time: 01/04/17 1436   None     Chief Complaint  Patient presents with  . unable to void  . Flank Pain  . Nausea   HPI  31 yo female, G2P1001, seen in office today with c/o only little trickles of urine since 5 pm yesterday, flank pain and nausea since this morning, no fever or chills or pain with voids. She is very thirsty and drinking water but not able to much this morning due to nausea Good FMs, no UCs/ LOF. NO HA/ SOB/ CP/swelling  H/o PIH in 1st preg. Marland Kitchen.    Past Medical History:  Diagnosis Date  . GERD (gastroesophageal reflux disease)   . Pregnancy induced hypertension    first pregnancy    Past Surgical History:  Procedure Laterality Date  . CESAREAN SECTION N/A 02/16/2014   Procedure: CESAREAN SECTION;  Surgeon: Leslie AndreaJames E Tomblin II, MD;  Location: [facility];  Service: Obstetrics;  Laterality: N/A;  . WISDOM TOOTH EXTRACTION      History reviewed. No pertinent family history.  Social History  Substance Use Topics  . Smoking status: Passive Smoke Exposure - Never Smoker  . Smokeless tobacco: Never Used  . Alcohol use No    Allergies: No Known Allergies  Meds; PNVit  Review of Systems  Nausea, back pain  Physical Exam   Blood pressure 133/75, pulse 85, temperature 98.5 F (36.9 C), temperature source Oral, resp. rate 18, weight 163 lb 4 oz (74 kg), last menstrual period 02/11/2016, SpO2 100 %, unknown if currently breastfeeding.  Physical Exam A&O x 3, no acute distress. Pleasant HEENT neg, no thyromegaly Lungs CTA bilat CV RRR, S1S2 normal Abdo soft, non tender, non acute, gravid, soft  Extr no edema/ tenderness Pelvic cx closed, long, high station FHT  Normal in office  Toco none per pt report   Office labs: Sterile straight cath done- 30 cc urine. Slightly dark. UA- spec 1.020 and Ketones ++ (>160)  CBC normal H/H and platelets CMP Crea 0.8, nl, BUN/AST/ALT/ Bili normal    MAU Course  Procedures   IV hydrate with 1 lit LR Repeat UA after hydration, cont until ketones clear   Renal/ bladder sono - right mild hydronephprosis and left mild pylectasis, normal bladder   Assessment and Plan  31 yo female with subjective urinary retention.  Has oliguria with normal BUN/creatinine, normal BP, platelets, LFTs. Possible severe dehydration causing oliguria with no evidence of urinary retention and no acute hepato-renal process.  Hydrate and reassess.  Phenergan for nausea.   Henryk Ursin R 01/04/2017, 3:57 PM

## 2017-01-04 NOTE — Discharge Instructions (Signed)
Acute Urinary Retention, Female Urinary retention means you are unable to pee completely or at all (empty your bladder). Follow these instructions at home:  Drink enough fluids to keep your pee (urine) clear or pale yellow.  If you are sent home with a tube that drains the bladder (catheter), there will be a drainage bag attached to it. There are two types of bags. One is big that you can wear at night without having to empty it. One is smaller and needs to be emptied more often.  Keep the drainage bag emptied.  Keep the drainage bag lower than the tube.  Only take medicine as told by your doctor. Contact a doctor if:  You have a low-grade fever.  You have spasms or you are leaking pee when you have spasms. Get help right away if:  You have chills or a fever.  Your catheter stops draining pee.  Your catheter falls out.  You have increased bleeding that does not stop after you have rested and increased the amount of fluids you had been drinking. This information is not intended to replace advice given to you by your health care provider. Make sure you discuss any questions you have with your health care provider. Document Released: 03/29/2008 Document Revised: 03/18/2016 Document Reviewed: 03/22/2013 Elsevier Interactive Patient Education  2017 Elsevier Inc.  

## 2017-01-04 NOTE — MAU Note (Signed)
Hadn't been able to pee since 1700 yesterday. Has started to pee a little more since US.  Having pain in rt flank area, started yesterday evening.  Starting to get extremely nauseous.

## 2017-01-04 NOTE — MAU Provider Note (Signed)
Pt able to void since getting IV fluids.  Plan to D/c home after 2nd liter. Needs to check u/op at home, sending home with hat for commode and instructions reviewed. Needs to hydrate well. Sono, offce CBC, CMP d/w pt and reassured Right back/flank pain is SI joint pain on exam, comfort care reviewed Hilary Hertz-V.Joaovictor Krone, MD

## 2017-01-13 DIAGNOSIS — Z3A32 32 weeks gestation of pregnancy: Secondary | ICD-10-CM | POA: Diagnosis not present

## 2017-01-13 DIAGNOSIS — O4443 Low lying placenta NOS or without hemorrhage, third trimester: Secondary | ICD-10-CM | POA: Diagnosis not present

## 2017-01-25 DIAGNOSIS — O4443 Low lying placenta NOS or without hemorrhage, third trimester: Secondary | ICD-10-CM | POA: Diagnosis not present

## 2017-01-25 DIAGNOSIS — Z3A33 33 weeks gestation of pregnancy: Secondary | ICD-10-CM | POA: Diagnosis not present

## 2017-01-31 ENCOUNTER — Other Ambulatory Visit: Payer: Self-pay | Admitting: Obstetrics & Gynecology

## 2017-02-08 DIAGNOSIS — O4443 Low lying placenta NOS or without hemorrhage, third trimester: Secondary | ICD-10-CM | POA: Diagnosis not present

## 2017-02-08 DIAGNOSIS — Z3685 Encounter for antenatal screening for Streptococcus B: Secondary | ICD-10-CM | POA: Diagnosis not present

## 2017-02-08 DIAGNOSIS — Z3A35 35 weeks gestation of pregnancy: Secondary | ICD-10-CM | POA: Diagnosis not present

## 2017-02-08 LAB — OB RESULTS CONSOLE GBS: GBS: NEGATIVE

## 2017-02-15 DIAGNOSIS — Z3A36 36 weeks gestation of pregnancy: Secondary | ICD-10-CM | POA: Diagnosis not present

## 2017-02-15 DIAGNOSIS — O4443 Low lying placenta NOS or without hemorrhage, third trimester: Secondary | ICD-10-CM | POA: Diagnosis not present

## 2017-02-21 DIAGNOSIS — Z3A37 37 weeks gestation of pregnancy: Secondary | ICD-10-CM | POA: Diagnosis not present

## 2017-02-21 DIAGNOSIS — O4443 Low lying placenta NOS or without hemorrhage, third trimester: Secondary | ICD-10-CM | POA: Diagnosis not present

## 2017-02-23 ENCOUNTER — Encounter (HOSPITAL_COMMUNITY): Payer: Self-pay

## 2017-02-23 ENCOUNTER — Telehealth (HOSPITAL_COMMUNITY): Payer: Self-pay | Admitting: *Deleted

## 2017-02-23 NOTE — Telephone Encounter (Signed)
Preadmission screen  

## 2017-02-24 ENCOUNTER — Encounter (HOSPITAL_COMMUNITY): Payer: Self-pay

## 2017-02-28 DIAGNOSIS — O4443 Low lying placenta NOS or without hemorrhage, third trimester: Secondary | ICD-10-CM | POA: Diagnosis not present

## 2017-02-28 DIAGNOSIS — Z3A38 38 weeks gestation of pregnancy: Secondary | ICD-10-CM | POA: Diagnosis not present

## 2017-03-01 ENCOUNTER — Other Ambulatory Visit: Payer: Self-pay | Admitting: Obstetrics & Gynecology

## 2017-03-02 ENCOUNTER — Encounter (HOSPITAL_COMMUNITY)
Admission: RE | Admit: 2017-03-02 | Discharge: 2017-03-02 | Disposition: A | Discharge status: Home / Self Care | Payer: BLUE CROSS/BLUE SHIELD | Source: Ambulatory Visit | Attending: Obstetrics & Gynecology | Admitting: Obstetrics & Gynecology

## 2017-03-02 DIAGNOSIS — Z3A00 Weeks of gestation of pregnancy not specified: Secondary | ICD-10-CM | POA: Diagnosis not present

## 2017-03-02 DIAGNOSIS — Z6791 Unspecified blood type, Rh negative: Secondary | ICD-10-CM | POA: Diagnosis not present

## 2017-03-02 DIAGNOSIS — K219 Gastro-esophageal reflux disease without esophagitis: Secondary | ICD-10-CM | POA: Diagnosis not present

## 2017-03-02 DIAGNOSIS — O26893 Other specified pregnancy related conditions, third trimester: Secondary | ICD-10-CM | POA: Diagnosis not present

## 2017-03-02 DIAGNOSIS — Z3A39 39 weeks gestation of pregnancy: Secondary | ICD-10-CM | POA: Diagnosis not present

## 2017-03-02 DIAGNOSIS — Z23 Encounter for immunization: Secondary | ICD-10-CM | POA: Diagnosis not present

## 2017-03-02 DIAGNOSIS — O34211 Maternal care for low transverse scar from previous cesarean delivery: Secondary | ICD-10-CM | POA: Diagnosis not present

## 2017-03-02 DIAGNOSIS — O34219 Maternal care for unspecified type scar from previous cesarean delivery: Secondary | ICD-10-CM | POA: Diagnosis not present

## 2017-03-02 DIAGNOSIS — O1494 Unspecified pre-eclampsia, complicating childbirth: Secondary | ICD-10-CM | POA: Diagnosis not present

## 2017-03-02 DIAGNOSIS — O9962 Diseases of the digestive system complicating childbirth: Secondary | ICD-10-CM | POA: Diagnosis not present

## 2017-03-02 DIAGNOSIS — Z3493 Encounter for supervision of normal pregnancy, unspecified, third trimester: Secondary | ICD-10-CM | POA: Diagnosis not present

## 2017-03-02 LAB — CBC
HCT: 36.2 % (ref 36.0–46.0)
Hemoglobin: 12.5 g/dL (ref 12.0–15.0)
MCH: 30.9 pg (ref 26.0–34.0)
MCHC: 34.5 g/dL (ref 30.0–36.0)
MCV: 89.6 fL (ref 78.0–100.0)
PLATELETS: 180 10*3/uL (ref 150–400)
RBC: 4.04 MIL/uL (ref 3.87–5.11)
RDW: 16 % — AB (ref 11.5–15.5)
WBC: 8.7 10*3/uL (ref 4.0–10.5)

## 2017-03-02 LAB — TYPE AND SCREEN
ABO/RH(D): B NEG
ANTIBODY SCREEN: NEGATIVE

## 2017-03-02 NOTE — Anesthesia Preprocedure Evaluation (Addendum)
Anesthesia Evaluation    Reviewed: Allergy & Precautions, H&P , Patient's Chart, lab work & pertinent test results  History of Anesthesia Complications Negative for: history of anesthetic complications  Airway Mallampati: II  TM Distance: >3 FB Neck ROM: full    Dental no notable dental hx.    Pulmonary neg pulmonary ROS,    Pulmonary exam normal breath sounds clear to auscultation       Cardiovascular hypertension, negative cardio ROS Normal cardiovascular exam Rhythm:Regular Rate:Normal     Neuro/Psych negative neurological ROS  negative psych ROS   GI/Hepatic Neg liver ROS, GERD  ,  Endo/Other  negative endocrine ROS  Renal/GU negative Renal ROS  negative genitourinary   Musculoskeletal   Abdominal   Peds  Hematology negative hematology ROS (+)   Anesthesia Other Findings   Reproductive/Obstetrics (+) Pregnancy                            Anesthesia Physical  Anesthesia Plan  ASA: II  Anesthesia Plan: Spinal   Post-op Pain Management:    Induction:   Airway Management Planned: Natural Airway  Additional Equipment:   Intra-op Plan:   Post-operative Plan:   Informed Consent: I have reviewed the patients History and Physical, chart, labs and discussed the procedure including the risks, benefits and alternatives for the proposed anesthesia with the patient or authorized representative who has indicated his/her understanding and acceptance.   Dental advisory given  Plan Discussed with: CRNA  Anesthesia Plan Comments:        Anesthesia Quick Evaluation

## 2017-03-02 NOTE — Patient Instructions (Signed)
20 Carrie Marsh  03/02/2017   Your procedure is scheduled on:  03/03/2017  Enter through the Main Entrance of [facility] at 0530 AM.  Pick up the phone at the desk and dial [phone].   Call this number if you have problems the morning of surgery: [phone]   Remember:   Do not eat food:After Midnight.  Do not drink clear liquids: After Midnight.  Take these medicines the morning of surgery with A SIP OF WATER: none   Do not wear jewelry, make-up or nail polish.  Do not wear lotions, powders, or perfumes. Do not wear deodorant.  Do not shave 48 hours prior to surgery.  Do not bring valuables to the hospital.  [facility] is not   responsible for any belongings or valuables brought to the hospital.  Contacts, dentures or bridgework may not be worn into surgery.  Leave suitcase in the car. After surgery it may be brought to your room.  For patients admitted to the hospital, checkout time is 11:00 AM the day of              discharge.   Patients discharged the day of surgery will not be allowed to drive             home.  Name and phone number of your driver: na  Special Instructions:   N/A   Please read over the following fact sheets that you were given:   Surgical Site Infection Prevention

## 2017-03-03 ENCOUNTER — Inpatient Hospital Stay (HOSPITAL_COMMUNITY)
Admission: RE | Admit: 2017-03-03 | Payer: BLUE CROSS/BLUE SHIELD | Source: Ambulatory Visit | Admitting: Obstetrics & Gynecology

## 2017-03-03 ENCOUNTER — Inpatient Hospital Stay (HOSPITAL_COMMUNITY): Payer: BLUE CROSS/BLUE SHIELD | Admitting: Anesthesiology

## 2017-03-03 ENCOUNTER — Encounter (HOSPITAL_COMMUNITY)
Admission: AD | Disposition: A | Discharge status: Home / Self Care | Payer: Self-pay | Source: Ambulatory Visit | Attending: Obstetrics & Gynecology

## 2017-03-03 ENCOUNTER — Encounter (HOSPITAL_COMMUNITY): Payer: Self-pay

## 2017-03-03 ENCOUNTER — Inpatient Hospital Stay (HOSPITAL_COMMUNITY)
Admission: AD | Admit: 2017-03-03 | Discharge: 2017-03-05 | DRG: 766 | Disposition: A | Discharge status: Home / Self Care | Payer: BLUE CROSS/BLUE SHIELD | Source: Ambulatory Visit | Attending: Obstetrics & Gynecology | Admitting: Obstetrics & Gynecology

## 2017-03-03 DIAGNOSIS — E860 Dehydration: Secondary | ICD-10-CM

## 2017-03-03 DIAGNOSIS — O1494 Unspecified pre-eclampsia, complicating childbirth: Secondary | ICD-10-CM | POA: Diagnosis not present

## 2017-03-03 DIAGNOSIS — Z3A39 39 weeks gestation of pregnancy: Secondary | ICD-10-CM

## 2017-03-03 DIAGNOSIS — O26899 Other specified pregnancy related conditions, unspecified trimester: Secondary | ICD-10-CM

## 2017-03-03 DIAGNOSIS — Z6791 Unspecified blood type, Rh negative: Secondary | ICD-10-CM | POA: Diagnosis not present

## 2017-03-03 DIAGNOSIS — O34211 Maternal care for low transverse scar from previous cesarean delivery: Principal | ICD-10-CM | POA: Diagnosis present

## 2017-03-03 DIAGNOSIS — O9962 Diseases of the digestive system complicating childbirth: Secondary | ICD-10-CM | POA: Diagnosis present

## 2017-03-03 DIAGNOSIS — Z3493 Encounter for supervision of normal pregnancy, unspecified, third trimester: Secondary | ICD-10-CM | POA: Diagnosis present

## 2017-03-03 DIAGNOSIS — O26893 Other specified pregnancy related conditions, third trimester: Secondary | ICD-10-CM | POA: Diagnosis present

## 2017-03-03 DIAGNOSIS — K219 Gastro-esophageal reflux disease without esophagitis: Secondary | ICD-10-CM | POA: Diagnosis present

## 2017-03-03 DIAGNOSIS — O34219 Maternal care for unspecified type scar from previous cesarean delivery: Secondary | ICD-10-CM | POA: Diagnosis not present

## 2017-03-03 DIAGNOSIS — Z3A00 Weeks of gestation of pregnancy not specified: Secondary | ICD-10-CM | POA: Diagnosis not present

## 2017-03-03 DIAGNOSIS — R824 Acetonuria: Secondary | ICD-10-CM

## 2017-03-03 LAB — RPR: RPR Ser Ql: NONREACTIVE

## 2017-03-03 LAB — POCT FERN TEST
POCT FERN TEST: POSITIVE
POCT Fern Test: POSITIVE

## 2017-03-03 SURGERY — Surgical Case
Anesthesia: Spinal

## 2017-03-03 MED ORDER — NALBUPHINE HCL 10 MG/ML IJ SOLN: 5.0000 mg | Freq: Once | INTRAMUSCULAR | Status: DC | PRN

## 2017-03-03 MED ORDER — MEPERIDINE HCL 25 MG/ML IJ SOLN
INTRAMUSCULAR | Status: AC
  Filled 2017-03-03: qty 1

## 2017-03-03 MED ORDER — TETANUS-DIPHTH-ACELL PERTUSSIS 5-2.5-18.5 LF-MCG/0.5 IM SUSP: 0.5000 mL | Freq: Once | INTRAMUSCULAR | Status: DC

## 2017-03-03 MED ORDER — DIPHENHYDRAMINE HCL 50 MG/ML IJ SOLN: 12.5000 mg | INTRAMUSCULAR | Status: DC | PRN

## 2017-03-03 MED ORDER — FENTANYL CITRATE (PF) 100 MCG/2ML IJ SOLN
INTRAMUSCULAR | Status: DC | PRN
  Administered 2017-03-03: 10 ug via INTRATHECAL

## 2017-03-03 MED ORDER — ZOLPIDEM TARTRATE 5 MG PO TABS: 5.0000 mg | ORAL_TABLET | Freq: Every evening | ORAL | Status: DC | PRN

## 2017-03-03 MED ORDER — FENTANYL CITRATE (PF) 100 MCG/2ML IJ SOLN
INTRAMUSCULAR | Status: AC
  Filled 2017-03-03: qty 2

## 2017-03-03 MED ORDER — MORPHINE SULFATE (PF) 0.5 MG/ML IJ SOLN
INTRAMUSCULAR | Status: AC
  Filled 2017-03-03: qty 10

## 2017-03-03 MED ORDER — DIPHENHYDRAMINE HCL 50 MG/ML IJ SOLN
INTRAMUSCULAR | Status: DC | PRN
  Administered 2017-03-03: 25 mg via INTRAVENOUS

## 2017-03-03 MED ORDER — WITCH HAZEL-GLYCERIN EX PADS: 1.0000 "application " | MEDICATED_PAD | CUTANEOUS | Status: DC | PRN

## 2017-03-03 MED ORDER — BUPIVACAINE IN DEXTROSE 0.75-8.25 % IT SOLN
INTRATHECAL | Status: DC | PRN
  Administered 2017-03-03: 1.4 mL via INTRATHECAL

## 2017-03-03 MED ORDER — FAMOTIDINE IN NACL 20-0.9 MG/50ML-% IV SOLN
INTRAVENOUS | Status: AC
  Filled 2017-03-03: qty 50

## 2017-03-03 MED ORDER — SCOPOLAMINE 1 MG/3DAYS TD PT72: 1.0000 | MEDICATED_PATCH | Freq: Once | TRANSDERMAL | Status: DC

## 2017-03-03 MED ORDER — PRENATAL MULTIVITAMIN CH
1.0000 | ORAL_TABLET | Freq: Every day | ORAL | Status: DC
  Administered 2017-03-03 – 2017-03-05 (×3): 1 via ORAL
  Filled 2017-03-03 (×3): qty 1

## 2017-03-03 MED ORDER — NALBUPHINE HCL 10 MG/ML IJ SOLN: 5.0000 mg | INTRAMUSCULAR | Status: DC | PRN

## 2017-03-03 MED ORDER — LACTATED RINGERS IV SOLN: INTRAVENOUS | Status: DC

## 2017-03-03 MED ORDER — MORPHINE SULFATE (PF) 0.5 MG/ML IJ SOLN
INTRAMUSCULAR | Status: DC | PRN
  Administered 2017-03-03: .2 mg via INTRATHECAL

## 2017-03-03 MED ORDER — NALOXONE HCL 0.4 MG/ML IJ SOLN: 0.4000 mg | INTRAMUSCULAR | Status: DC | PRN

## 2017-03-03 MED ORDER — IBUPROFEN 600 MG PO TABS
600.0000 mg | ORAL_TABLET | Freq: Four times a day (QID) | ORAL | Status: DC
  Administered 2017-03-04 – 2017-03-05 (×7): 600 mg via ORAL
  Filled 2017-03-03 (×8): qty 1

## 2017-03-03 MED ORDER — MEPERIDINE HCL 25 MG/ML IJ SOLN: 6.2500 mg | INTRAMUSCULAR | Status: DC | PRN

## 2017-03-03 MED ORDER — KETOROLAC TROMETHAMINE 30 MG/ML IJ SOLN
30.0000 mg | Freq: Four times a day (QID) | INTRAMUSCULAR | Status: AC | PRN
Start: 2017-03-03 — End: 2017-03-04

## 2017-03-03 MED ORDER — DIPHENHYDRAMINE HCL 25 MG PO CAPS
25.0000 mg | ORAL_CAPSULE | ORAL | Status: DC | PRN
  Filled 2017-03-03: qty 1

## 2017-03-03 MED ORDER — LACTATED RINGERS IV SOLN
INTRAVENOUS | Status: DC
  Administered 2017-03-03 – 2017-03-04 (×3): via INTRAVENOUS

## 2017-03-03 MED ORDER — ONDANSETRON HCL 4 MG/2ML IJ SOLN
INTRAMUSCULAR | Status: DC | PRN
  Administered 2017-03-03: 4 mg via INTRAVENOUS

## 2017-03-03 MED ORDER — OXYTOCIN 40 UNITS IN LACTATED RINGERS INFUSION - SIMPLE MED: 2.5000 [IU]/h | INTRAVENOUS | Status: AC

## 2017-03-03 MED ORDER — ONDANSETRON HCL 4 MG/2ML IJ SOLN: 4.0000 mg | Freq: Three times a day (TID) | INTRAMUSCULAR | Status: DC | PRN

## 2017-03-03 MED ORDER — CEFAZOLIN SODIUM-DEXTROSE 2-4 GM/100ML-% IV SOLN
2.0000 g | INTRAVENOUS | Status: AC
  Administered 2017-03-03: 2 g via INTRAVENOUS

## 2017-03-03 MED ORDER — SIMETHICONE 80 MG PO CHEW: 80.0000 mg | CHEWABLE_TABLET | ORAL | Status: DC | PRN

## 2017-03-03 MED ORDER — SOD CITRATE-CITRIC ACID 500-334 MG/5ML PO SOLN
ORAL | Status: AC
  Filled 2017-03-03: qty 15

## 2017-03-03 MED ORDER — OXYCODONE HCL 5 MG PO TABS
5.0000 mg | ORAL_TABLET | ORAL | Status: DC | PRN
  Administered 2017-03-03 – 2017-03-05 (×8): 5 mg via ORAL
  Filled 2017-03-03 (×7): qty 1

## 2017-03-03 MED ORDER — SIMETHICONE 80 MG PO CHEW
80.0000 mg | CHEWABLE_TABLET | Freq: Three times a day (TID) | ORAL | Status: DC
  Administered 2017-03-03 – 2017-03-05 (×5): 80 mg via ORAL
  Filled 2017-03-03 (×6): qty 1

## 2017-03-03 MED ORDER — SOD CITRATE-CITRIC ACID 500-334 MG/5ML PO SOLN
30.0000 mL | Freq: Once | ORAL | Status: AC
  Administered 2017-03-03: 30 mL via ORAL

## 2017-03-03 MED ORDER — OXYTOCIN 10 UNIT/ML IJ SOLN
INTRAVENOUS | Status: DC | PRN
  Administered 2017-03-03: 40 [IU] via INTRAVENOUS

## 2017-03-03 MED ORDER — DEXAMETHASONE SODIUM PHOSPHATE 4 MG/ML IJ SOLN
INTRAMUSCULAR | Status: DC | PRN
  Administered 2017-03-03: 4 mg via INTRAVENOUS

## 2017-03-03 MED ORDER — CEFAZOLIN SODIUM-DEXTROSE 2-4 GM/100ML-% IV SOLN
2.0000 g | INTRAVENOUS | Status: DC
  Filled 2017-03-03 (×2): qty 100

## 2017-03-03 MED ORDER — ACETAMINOPHEN 325 MG PO TABS
650.0000 mg | ORAL_TABLET | ORAL | Status: DC | PRN
  Administered 2017-03-03 – 2017-03-05 (×6): 650 mg via ORAL
  Filled 2017-03-03 (×6): qty 2

## 2017-03-03 MED ORDER — NALOXONE HCL 2 MG/2ML IJ SOSY
1.0000 ug/kg/h | PREFILLED_SYRINGE | INTRAVENOUS | Status: DC | PRN
  Filled 2017-03-03: qty 2

## 2017-03-03 MED ORDER — FENTANYL CITRATE (PF) 100 MCG/2ML IJ SOLN
25.0000 ug | INTRAMUSCULAR | Status: DC | PRN
  Administered 2017-03-03: 25 ug via INTRAVENOUS

## 2017-03-03 MED ORDER — LACTATED RINGERS IV BOLUS (SEPSIS)
1000.0000 mL | Freq: Once | INTRAVENOUS | Status: AC
  Administered 2017-03-03: 1000 mL via INTRAVENOUS
  Administered 2017-03-03: 04:00:00 via INTRAVENOUS
  Administered 2017-03-03: 1000 mL via INTRAVENOUS
  Administered 2017-03-03: 1100 mL via INTRAVENOUS

## 2017-03-03 MED ORDER — MEPERIDINE HCL 25 MG/ML IJ SOLN
INTRAMUSCULAR | Status: DC | PRN
  Administered 2017-03-03 (×2): 12.5 mg via INTRAVENOUS

## 2017-03-03 MED ORDER — NALBUPHINE HCL 10 MG/ML IJ SOLN
5.0000 mg | INTRAMUSCULAR | Status: DC | PRN
  Administered 2017-03-03: 5 mg via SUBCUTANEOUS
  Filled 2017-03-03: qty 1

## 2017-03-03 MED ORDER — SODIUM CHLORIDE 0.9% FLUSH: 3.0000 mL | INTRAVENOUS | Status: DC | PRN

## 2017-03-03 MED ORDER — FAMOTIDINE IN NACL 20-0.9 MG/50ML-% IV SOLN
20.0000 mg | Freq: Once | INTRAVENOUS | Status: AC
  Administered 2017-03-03: 20 mg via INTRAVENOUS

## 2017-03-03 MED ORDER — KETOROLAC TROMETHAMINE 30 MG/ML IJ SOLN
30.0000 mg | Freq: Four times a day (QID) | INTRAMUSCULAR | Status: AC | PRN
  Administered 2017-03-03: 30 mg via INTRAVENOUS
  Filled 2017-03-03: qty 1

## 2017-03-03 MED ORDER — KETOROLAC TROMETHAMINE 30 MG/ML IJ SOLN
INTRAMUSCULAR | Status: AC
  Filled 2017-03-03: qty 1

## 2017-03-03 MED ORDER — SIMETHICONE 80 MG PO CHEW
80.0000 mg | CHEWABLE_TABLET | ORAL | Status: DC
  Administered 2017-03-04 (×2): 80 mg via ORAL
  Filled 2017-03-03 (×2): qty 1

## 2017-03-03 MED ORDER — COCONUT OIL OIL: 1.0000 "application " | TOPICAL_OIL | Status: DC | PRN

## 2017-03-03 MED ORDER — SCOPOLAMINE 1 MG/3DAYS TD PT72
MEDICATED_PATCH | TRANSDERMAL | Status: DC | PRN
  Administered 2017-03-03: 1 via TRANSDERMAL

## 2017-03-03 MED ORDER — MENTHOL 3 MG MT LOZG: 1.0000 | LOZENGE | OROMUCOSAL | Status: DC | PRN

## 2017-03-03 MED ORDER — SENNOSIDES-DOCUSATE SODIUM 8.6-50 MG PO TABS
2.0000 | ORAL_TABLET | ORAL | Status: DC
  Administered 2017-03-04 (×2): 2 via ORAL
  Filled 2017-03-03 (×2): qty 2

## 2017-03-03 MED ORDER — DIPHENHYDRAMINE HCL 25 MG PO CAPS: 25.0000 mg | ORAL_CAPSULE | Freq: Four times a day (QID) | ORAL | Status: DC | PRN

## 2017-03-03 MED ORDER — DIBUCAINE 1 % RE OINT: 1.0000 "application " | TOPICAL_OINTMENT | RECTAL | Status: DC | PRN

## 2017-03-03 MED ORDER — METOCLOPRAMIDE HCL 5 MG/ML IJ SOLN: 10.0000 mg | Freq: Once | INTRAMUSCULAR | Status: DC | PRN

## 2017-03-03 SURGICAL SUPPLY — 34 items
BENZOIN TINCTURE PRP APPL 2/3 (GAUZE/BANDAGES/DRESSINGS) IMPLANT
CHLORAPREP W/TINT 26ML (MISCELLANEOUS) ×2 IMPLANT
CLAMP CORD UMBIL (MISCELLANEOUS) IMPLANT
CLOTH BEACON ORANGE TIMEOUT ST (SAFETY) ×2 IMPLANT
CONTAINER PREFILL 10% NBF 15ML (MISCELLANEOUS) IMPLANT
DRSG OPSITE POSTOP 4X10 (GAUZE/BANDAGES/DRESSINGS) ×2 IMPLANT
ELECT REM PT RETURN 9FT ADLT (ELECTROSURGICAL) ×2
ELECTRODE REM PT RTRN 9FT ADLT (ELECTROSURGICAL) ×1 IMPLANT
EXTRACTOR VACUUM KIWI (MISCELLANEOUS) IMPLANT
EXTRACTOR VACUUM M CUP 4 TUBE (SUCTIONS) IMPLANT
GLOVE BIO SURGEON STRL SZ7 (GLOVE) ×2 IMPLANT
GLOVE BIOGEL PI IND STRL 7.0 (GLOVE) ×2 IMPLANT
GLOVE BIOGEL PI INDICATOR 7.0 (GLOVE) ×2
GOWN STRL REUS W/TWL LRG LVL3 (GOWN DISPOSABLE) ×4 IMPLANT
KIT ABG SYR 3ML LUER SLIP (SYRINGE) IMPLANT
NEEDLE HYPO 25X5/8 SAFETYGLIDE (NEEDLE) IMPLANT
NS IRRIG 1000ML POUR BTL (IV SOLUTION) ×2 IMPLANT
PACK C SECTION WH (CUSTOM PROCEDURE TRAY) ×2 IMPLANT
PAD OB MATERNITY 4.3X12.25 (PERSONAL CARE ITEMS) ×2 IMPLANT
RTRCTR C-SECT PINK 25CM LRG (MISCELLANEOUS) ×2 IMPLANT
STRIP CLOSURE SKIN 1/2X4 (GAUZE/BANDAGES/DRESSINGS) ×2 IMPLANT
SUT MON AB-0 CT1 36 (SUTURE) ×6 IMPLANT
SUT PLAIN 0 NONE (SUTURE) IMPLANT
SUT PLAIN 2 0 (SUTURE)
SUT PLAIN 2 0 XLH (SUTURE) ×2 IMPLANT
SUT PLAIN ABS 2-0 CT1 27XMFL (SUTURE) IMPLANT
SUT VIC AB 0 CT1 27 (SUTURE) ×2
SUT VIC AB 0 CT1 27XBRD ANBCTR (SUTURE) ×2 IMPLANT
SUT VIC AB 2-0 CT1 27 (SUTURE) ×3
SUT VIC AB 2-0 CT1 TAPERPNT 27 (SUTURE) ×3 IMPLANT
SUT VIC AB 4-0 KS 27 (SUTURE) ×2 IMPLANT
SUT VICRYL 0 TIES 12 18 (SUTURE) IMPLANT
TOWEL OR 17X24 6PK STRL BLUE (TOWEL DISPOSABLE) ×2 IMPLANT
TRAY FOLEY BAG SILVER LF 14FR (SET/KITS/TRAYS/PACK) IMPLANT

## 2017-03-03 NOTE — MAU Note (Signed)
FHR tracing from [id number] does not belong to this patient. That portion of the tracing belongs to [employer]ia Brown

## 2017-03-03 NOTE — Op Note (Signed)
Cesarean Section Procedure Note   Steva ColderHeather C Froning  03/03/2017  Indications: 39 wks SROM, prior C/section, desires repeat C-section  Pre-operative Diagnosis: Previous Cesarean Section.   Post-operative Diagnosis: Same   Surgeon:  Shea EvansMody, Carolanne Mercier, MD   Assistants: none  Anesthesia: spinal   Procedure Details:  The patient was seen in the Triage Room. The risks, benefits, complications, treatment options, and expected outcomes were discussed with the patient. The patient concurred with the proposed plan, giving informed consent. identified as Steva ColderHeather C Levee and the procedure verified as C-Section Delivery.  She was brought to the OR and a Time Out was held and the above information confirmed. 2 gm Ancef given.  After induction of anesthesia, the patient was draped and prepped in the usual sterile manner and foley was placed.  A Pfannenstiel Incision was made through prior scar and carried down through the subcutaneous tissue to the fascia. Fascial incision was made and extended transversely. The fascia was separated from the underlying rectus tissue superiorly and inferiorly. The peritoneum was identified and entered. Peritoneal incision was extended longitudinally. Alexis retractor placed. The utero-vesical peritoneal reflection was incised transversely and the bladder flap was bluntly freed from the lower uterine segment. A low transverse uterine incision was made. Clear amniotic fluid drained. Delivered from cephalic presentation was a vigorous female infant at 4.20 AM  with Apgar scores of 9 at one minute and 10 at five minutes. Delayed cord clamping done. Cord ph was not sent. The umbilical cord was obtained for evaluation. The placenta was removed Intact and appeared normal. The uterine outline noted extension of right angle downward. Tubes and ovaries appeared normal. The uterine extension was closed in 2 layers with 0-Monocryl. Then the incision was closed with running locked sutures of  0Monocryl followed by a second imbricating layer. Hemostasis was observed. Alexis retractor removed. Peritoneum closed with 2-0Vicryl. Muscles approximated with 2-0 vicryl . The fascia was then reapproximated with running sutures of 0Vicryl. The subcuticular closure was performed using 2-0plain gut. The skin was closed with 4-0Vicryl.  Steristrips and honeycomb dressing placed.   Instrument, sponge, and needle counts were correct prior the abdominal closure and were correct at the conclusion of the case.   Findings: Female delivered at 4.20 am on 03/03/17, cephalic via Kerr hysterotomy. Apgars 9 and 10. 2 layer closure. Normal placenta, cord, tubes and ovaries    Estimated Blood Loss: 500 cc  Total IV Fluids: 2100 cc LR   Urine Output: 100CC OF clear urine  Specimens: cord blood  Complications: no complications  Disposition: PACU - hemodynamically stable.   Maternal Condition: stable   Baby condition / location:  Couplet care / Skin to Skin  Attending Attestation: I performed the procedure.   Signed: Surgeon(s): Shea EvansMody, Kitana Gage, MD

## 2017-03-03 NOTE — Anesthesia Postprocedure Evaluation (Signed)
Anesthesia Post Note  Patient: Carrie ColderHeather C Marsh  Procedure(s) Performed: Procedure(s) (LRB): Repeat CESAREAN SECTION (N/A)  Patient location during evaluation: SICU Anesthesia Type: Spinal Level of consciousness: sedated Pain management: pain level controlled Vital Signs Assessment: post-procedure vital signs reviewed and stable Respiratory status: patient remains intubated per anesthesia plan Cardiovascular status: stable Anesthetic complications: no        Last Vitals:  Vitals:   03/03/17 0645 03/03/17 0700  BP: 129/86   Pulse: 65 62  Resp: 18 17  Temp: 37.2 C     Last Pain:  Vitals:   03/03/17 0700  TempSrc:   PainSc: 3    Pain Goal: Patients Stated Pain Goal: 0 (03/03/17 0225)  LLE Motor Response: Purposeful movement (03/03/17 0700) LLE Sensation: Tingling (03/03/17 0700) RLE Motor Response: Purposeful movement (03/03/17 0700) RLE Sensation: Tingling (03/03/17 0700) L Sensory Level: T4-Nipple line (03/03/17 0700) R Sensory Level: T4-Nipple line (03/03/17 0700)  [name]

## 2017-03-03 NOTE — Anesthesia Procedure Notes (Signed)
Spinal  Patient location during procedure: OR Staffing Anesthesiologist: Latish Toutant Performed: anesthesiologist  Preanesthetic Checklist Completed: patient identified, site marked, surgical consent, pre-op evaluation, timeout performed, IV checked, risks and benefits discussed and monitors and equipment checked Spinal Block Patient position: sitting Prep: DuraPrep Patient monitoring: heart rate, continuous pulse ox and blood pressure Approach: midline Location: L4-5 Injection technique: single-shot Needle Needle type: Sprotte  Needle gauge: 24 G Needle length: 9 cm Additional Notes Expiration date of kit checked and confirmed. Patient tolerated procedure well, without complications.       

## 2017-03-03 NOTE — Addendum Note (Signed)
Addendum  created 03/03/17 1339 by Shanon PayorGregory, Saliha Salts M, CRNA   Sign clinical note

## 2017-03-03 NOTE — Lactation Note (Addendum)
This note was copied from a baby's chart. Lactation Consultation Note  Patient Name: Carrie Marsh ZOXWR'UToday's Date: 03/03/2017 Reason for consult: Initial assessment;Other (Comment) (low blood sugar )  Adm Nurse Pleas KochJessica Serva [facility] the last blood sugar = 40.  Ans Asked the LC to assist mom with feeding.  LC offered to assist with hand expressing, mom receptive, LC alternating with mom  And was able to hand express off 2 ml. Baby presently laying asleep in the crib.  LC woke baby up, sat up in 45 degrees, allows baby to suck on gloved finger, and then instructed dad to slowly instill the curved tip syringe in the side of baby's mouth and baby tolerated well, and then LC spoon fed baby 2 ml of EBM. Mom very tired, on and off sleepy .  Dad receptive to doing skin to skin. Pleas KochJessica Serva Adm RN aware of the end time for feeding . F/u blood glucose is @1700 .   Mom aware to call for feeding assessment/ latch assessment.      Maternal Data Has patient been taught Hand Expression?: Yes (2 ml obtained , assisted and mom able to hand express well ) Does the patient have breastfeeding experience prior to this delivery?: Yes  Feeding   LATCH Score/Interventions  Lactation Tools Discussed/Used WIC Program: No   Consult Status Consult Status: Follow-up Date: 03/03/17 Follow-up type: In-patient    Matilde SprangMargaret Ann Kelby Adell 03/03/2017, 3:39 PM

## 2017-03-03 NOTE — Plan of Care (Signed)
Problem: Tissue Perfusion: Goal: Risk factors for ineffective tissue perfusion will decrease Outcome: Completed/Met Date Met: 03/03/17 SCD's in place   Problem: Education: Goal: Knowledge of condition will improve Admission paperwork, safety and protocols reviewed with patient and significant other. Patient verbalizes understanding of information.

## 2017-03-03 NOTE — Transfer of Care (Signed)
Immediate Anesthesia Transfer of Care Note  Patient: Carrie Marsh  Procedure(s) Performed: Procedure(s) with comments: Repeat CESAREAN SECTION (N/A) - EDD: 03/10/17  Patient Location: PACU  Anesthesia Type:Spinal  Level of Consciousness: awake, alert  and oriented  Airway & Oxygen Therapy: Patient Spontanous Breathing  Post-op Assessment: Report given to RN and Post -op Vital signs reviewed and stable  Post vital signs: Reviewed and stable  Last Vitals:  Vitals:   03/03/17 0313  BP: 134/72  Pulse: 72  Resp: 18  Temp: 36.7 C    Last Pain:  Vitals:   03/03/17 0313  TempSrc: Oral  PainSc:       Patients Stated Pain Goal: 0 (03/03/17 0225)  Complications: No apparent anesthesia complications

## 2017-03-03 NOTE — H&P (Signed)
Carrie Marsh is a 31 y.o. female presenting at 3239 wks with SROM, clear fluid 1.15 am. UCs getting stronger. Good FMs.  Prior C/s x1, desires repeat elective C/section and declined TOLAC.  Pregnancy complicated by few elevated BPs, no GHTN or PEC. Prior h/o PEC.   OB History    Gravida Para Term Preterm AB Living   2 1 1     1    SAB TAB Ectopic Multiple Live Births           1     Past Medical History:  Diagnosis Date  . Dehydration 01/04/2017  . GERD (gastroesophageal reflux disease)   . History of gestational hypertension 01/04/2017  . Ketonuria 01/04/2017  . Oliguria 01/04/2017  . Pregnancy induced hypertension    first pregnancy   Past Surgical History:  Procedure Laterality Date  . CESAREAN SECTION N/A 02/16/2014   Procedure: CESAREAN SECTION;  Surgeon: Carrie AndreaJames E Tomblin II, MD;  Location: [facility];  Service: Obstetrics;  Laterality: N/A;  . WISDOM TOOTH EXTRACTION     Family History: family history includes Cancer in her paternal grandmother; Diabetes in her father and mother; Rheum arthritis in her father. Social History:  reports that she is a non-smoker but has been exposed to tobacco smoke. She has never used smokeless tobacco. She reports that she does not drink alcohol or use drugs.     Maternal Diabetes: No Genetic Screening: Normal Maternal Ultrasounds/Referrals: Normal Fetal Ultrasounds or other Referrals:  None Maternal Substance Abuse:  No Significant Maternal Medications:  Meds include: Zantac Significant Maternal Lab Results:  Lab values include: Group B Strep negative, Rh negative Other Comments:  None  ROS History Dilation: 3 Effacement (%): 80 Station: -2 Exam by:: Con-wayBridget Mosca RN Blood pressure 134/72, pulse 72, temperature 98 F (36.7 C), temperature source Oral, resp. rate 18, height 5\' 2"  (1.575 m), weight 171 lb (77.6 kg), last menstrual period 02/11/2016, SpO2 99 %, unknown if currently breastfeeding. Exam Physical Exam  Physical exam:  A&O x  3, no acute distress. Pleasant HEENT neg, no thyromegaly Lungs CTA bilat CV RRR, S1S2 normal Abdo soft, non tender, non acute Extr no edema/ tenderness Pelvic above per RN FHT  130s/ category I Toco regular q 3 min  Prenatal labs: ABO, Rh: --/--/B NEG (05/09 1110) Antibody: NEG (05/09 1110) Rubella: Immune (10/25 0000) RPR: Nonreactive (10/25 0000)  HBsAg: Negative (10/25 0000)  HIV: Non-reactive (10/25 0000)  GBS: Negative (04/17 0000)   Assessment/Plan: 39 wks, prior C/s x1. SROM. Proceed with urgent C/section.  Risks/complications of surgery reviewed incl infection, bleeding, damage to internal organs including bladder, bowels, ureters, blood vessels, other risks from anesthesia, VTE and delayed complications of any surgery, complications in future surgery reviewed. Also discussed neonatal complications incl difficult delivery, laceration, vacuum assistance, TTN etc. Pt understands and agrees, all concerns addressed.      Carrie Marsh R 03/03/2017, 3:45 AM

## 2017-03-03 NOTE — Anesthesia Postprocedure Evaluation (Signed)
Anesthesia Post Note  Patient: Carrie Marsh  Procedure(s) Performed: Procedure(s) (LRB): Repeat CESAREAN SECTION (N/A)  Patient location during evaluation: Mother Baby Anesthesia Type: Spinal Level of consciousness: awake and alert and oriented Pain management: satisfactory to patient Vital Signs Assessment: post-procedure vital signs reviewed and stable Respiratory status: spontaneous breathing and nonlabored ventilation Cardiovascular status: stable Postop Assessment: no headache, no backache, patient able to bend at knees, no signs of nausea or vomiting and adequate PO intake Anesthetic complications: no        Last Vitals:  Vitals:   03/03/17 0930 03/03/17 1100  BP: 129/63   Pulse: 60   Resp: 16   Temp: 37.1 C 37.3 C    Last Pain:  Vitals:   03/03/17 1100  TempSrc: Oral  PainSc:    Pain Goal: Patients Stated Pain Goal: 3 (03/03/17 0830)               Madison HickmanGREGORY,Dae Antonucci

## 2017-03-03 NOTE — MAU Note (Signed)
Water broke at [employer]. Clear fluid. Scheduled for C/S at 0530. No bleeding.

## 2017-03-03 NOTE — Lactation Note (Signed)
This note was copied from a baby's chart. Lactation Consultation Note  Patient Name: Carrie Marsh ZOXWR'UToday's Date: 03/03/2017 Reason for consult: Follow-up assessment  Mom called for assistance as baby was starting to cue.  Last blood sugar 45, so parents very encouraged.  Assisted with laid back positioning and baby eventually opened wide enough and along with sandwiching of breast tissue, baby latched on well.  After a few suck bursts, baby started popping on and off the breast and crying.  Tried both breasts following hand expression.  Initiated 5 fr feeding tube and syringe at the breast with 10 ml Alimentum, and baby stayed latch and rhythmically sucked and swallowed.  Set up DEBP at bedside, with instructions on pumping on initiation setting following breast feeding.  Mom to ask her nurse for assistance with pumping when baby is finished nursing.  Mom to supplement after each feeding. Mom has a history of low milk supply with her first baby (3 yrs ago).  She states she pumped for 2 weeks, only getting 1 oz per pumping.  Mom had positive breast changes with this pregnancy, and colostrum is easy to express.  Mom to ask for assistance as needed, and lactation to follow up in am.    Consult Status Consult Status: Follow-up Date: 03/04/17 Follow-up type: In-patient    [name], Reid Regas E 03/03/2017, 6:37 PM

## 2017-03-04 ENCOUNTER — Encounter (HOSPITAL_COMMUNITY): Payer: Self-pay

## 2017-03-04 LAB — CBC
HEMATOCRIT: 28.1 % — AB (ref 36.0–46.0)
Hemoglobin: 10 g/dL — ABNORMAL LOW (ref 12.0–15.0)
MCH: 31.8 pg (ref 26.0–34.0)
MCHC: 35.2 g/dL (ref 30.0–36.0)
MCV: 90.4 fL (ref 78.0–100.0)
Platelets: 161 10*3/uL (ref 150–400)
RBC: 3.11 MIL/uL — ABNORMAL LOW (ref 3.87–5.11)
RDW: 16.4 % — AB (ref 11.5–15.5)
WBC: 10.3 10*3/uL (ref 4.0–10.5)

## 2017-03-04 LAB — BIRTH TISSUE RECOVERY COLLECTION (PLACENTA DONATION)

## 2017-03-04 MED ORDER — RHO D IMMUNE GLOBULIN 1500 UNIT/2ML IJ SOSY
300.0000 ug | PREFILLED_SYRINGE | Freq: Once | INTRAMUSCULAR | Status: AC
  Administered 2017-03-04: 300 ug via INTRAVENOUS
  Filled 2017-03-04: qty 2

## 2017-03-04 NOTE — Plan of Care (Signed)
Problem: Activity: Goal: Will verbalize the importance of balancing activity with adequate rest periods Encouraged increased ambulation in hallways x3 today. Patient verbalizes understanding.

## 2017-03-04 NOTE — Progress Notes (Signed)
Post Partum Day 1 Subjective: no complaints, up ad lib, voiding, tolerating PO and + flatus  Objective: Blood pressure 133/83, pulse (!) 54, temperature 97.9 F (36.6 C), temperature source Oral, resp. rate 20, height 5\' 2"  (1.575 m), weight 77.6 kg (171 lb), last menstrual period 02/11/2016, SpO2 97 %, unknown if currently breastfeeding.  Physical Exam:  General: alert, cooperative and appears stated age Lochia: appropriate Uterine Fundus: firm Incision: healing well DVT Evaluation: No evidence of DVT seen on physical exam. Negative Homan's sign. No cords or calf tenderness.   Recent Labs  03/02/17 1110 03/04/17 0546  HGB 12.5 10.0*  HCT 36.2 28.1*    Assessment/Plan: NL POD 1 Plan for discharge tomorrow and Breastfeeding Possible DC tomorrow  LOS: 1 day   Jacody Beneke J 03/04/2017, 10:27 AM

## 2017-03-04 NOTE — Lactation Note (Signed)
This note was copied from a baby's chart. Lactation Consultation Note  Patient Name: Carrie Marsh [id number]'s Date: 03/04/2017 Reason for consult: Follow-up assessment;Difficult latch  Set up feeding tube at breast, baby would latch deeply and then slip onto nipple.  Switched to SNS at the breast, took 14 ml formula at the breast.  Baby kept slipping away from a deep latch, encouraged Mom to hold baby in closer.  Tried a 20 mm nipple shield to facilitate a deeper latch, but baby not interested.  Will try to use nipple shield at next feeding.   Encouraged Mom to double pump as she hasn't yet today.  Mom to pump on initiation setting after breastfeeding each time.   To assist prn, and follow up in am.  Consult Status Consult Status: Follow-up Date: 03/05/17 Follow-up type: In-patient    [name], Walida Cajas E 03/04/2017, 1:20 PM

## 2017-03-04 NOTE — Lactation Note (Signed)
This note was copied from a baby's chart. Lactation Consultation Note  Patient Name: Carrie Marsh [id number]'XToday's Date: 03/04/2017 Reason for consult: Follow-up assessment;Other (Comment);Infant weight loss (4% weight loss, Bilicheck - 3.8, encouraged mom to page with feeding cues )  Baby is 29 hours old and due to decreased blood sugars initially had to start supplementing with  EBM and then formula.  Per mom and dad last night had a difficulty latching, tried SNS, tried finger feeding, and neither worked so fed the baby from a bottle .  Last feeding was this am at 0845 - 15 ml , wet and stool ( LC updated doc flow sheet with RN orientee MiaKita Senaida Oresichardson.  Per mom still has a desire to re- latch her baby. Since the baby recently fed, LC encouraged mom to call with feeding cues for assist to re-latch.    Maternal Data    Feeding Feeding Type:  (per dad recently fed at 0845 ) Nipple Type: Slow - flow Length of feed: 15 min  LATCH Score/Interventions Latch:  (enc mother to call for latch if she decides to latch on)              Intervention(s): Breastfeeding basics reviewed     Lactation Tools Discussed/Used Tools: Pump Breast pump type: Double-Electric Breast Pump   Consult Status Consult Status: Follow-up Date: 03/04/17 Follow-up type: In-patient    Matilde SprangMargaret Ann Blakley Michna 03/04/2017, 10:06 AM

## 2017-03-05 DIAGNOSIS — Z6791 Unspecified blood type, Rh negative: Secondary | ICD-10-CM

## 2017-03-05 DIAGNOSIS — O26899 Other specified pregnancy related conditions, unspecified trimester: Secondary | ICD-10-CM

## 2017-03-05 LAB — RH IG WORKUP (INCLUDES ABO/RH)
ABO/RH(D): B NEG
Fetal Screen: NEGATIVE
GESTATIONAL AGE(WKS): 39
UNIT DIVISION: 0

## 2017-03-05 MED ORDER — COCONUT OIL OIL: 1.0000 "application " | TOPICAL_OIL | 0 refills | Status: DC | PRN

## 2017-03-05 MED ORDER — MAGNESIUM OXIDE 400 (241.3 MG) MG PO TABS: 400.0000 mg | ORAL_TABLET | Freq: Every day | ORAL | Status: DC

## 2017-03-05 MED ORDER — IBUPROFEN 600 MG PO TABS: 600.0000 mg | ORAL_TABLET | Freq: Four times a day (QID) | ORAL | 0 refills | Status: DC

## 2017-03-05 MED ORDER — OXYCODONE HCL 5 MG PO TABS: 5.0000 mg | ORAL_TABLET | ORAL | 0 refills | Status: DC | PRN

## 2017-03-05 MED ORDER — SIMETHICONE 80 MG PO CHEW: 80.0000 mg | CHEWABLE_TABLET | Freq: Three times a day (TID) | ORAL | 0 refills | Status: DC

## 2017-03-05 MED ORDER — SENNOSIDES-DOCUSATE SODIUM 8.6-50 MG PO TABS: 2.0000 | ORAL_TABLET | ORAL | Status: DC

## 2017-03-05 NOTE — Progress Notes (Signed)
Subjective: POD# 2 Information for the patient's newborn:  Carrie Marsh, Girl Erminia [[id number]][[id number]]  female  Baby name: Carrie Marsh  Reports feeling well, desires DC home. Feeding: breast Patient reports tolerating PO.  Breast symptoms: none Pain controlled withPO meds Denies HA/SOB/C/P/N/V/dizziness. Flatus present. She reports vaginal bleeding as normal, without clots.  She is ambulating, urinating without difficulty.     Objective:   VS:    Vitals:   03/04/17 0000 03/04/17 0600 03/04/17 1735 03/05/17 0515  BP: (!) 141/66 133/83 118/67 123/73  Pulse: (!) 51 (!) 54 64 68  Resp: 18 20 18 18   Temp: 98.3 F (36.8 C) 97.9 F (36.6 C) 98.1 F (36.7 C) 97.7 F (36.5 C)  TempSrc: Oral Oral Oral Oral  SpO2:      Weight:      Height:        No intake or output data in the 24 hours ending 03/05/17 1112      Recent Labs  03/04/17 0546  WBC 10.3  HGB 10.0*  HCT 28.1*  PLT 161     Blood type: --/--/B NEG (05/11 0546)  Rubella: Immune (10/25 0000)     Physical Exam:  General: alert, cooperative and no distress CV: Regular rate and rhythm Resp: clear Abdomen: soft, nontender, normal bowel sounds Incision:  dry and intact with minimal drainage Uterine Fundus: firm, below umbilicus, nontender Lochia: minimal Ext: edema +1 pedal, no cords or calf tenderness      Assessment/Plan: 31 y.o.   POD# 2. [id number]                  Principal Problem:   Postpartum care following cesarean delivery (5/10) Active Problems:   Cesarean delivery delivered (5/10), indication: repeat   Rh negative, maternal / Newborn Rh pos  - Rhophylac prophylaxis given  Doing well, stable.    Routine post-op care              DC home today w/ instructions  [facility] and PRN   Neta Mendsaniela C Paul, CNM, MSN 03/05/2017, 11:12 AM

## 2017-03-05 NOTE — Discharge Summary (Signed)
OB Discharge Summary     Patient Name: Carrie ColderHeather C Marsh DOB: 03/31/1986 MRN: [id number]  Date of admission: 03/03/2017 Delivering MD: MODY, VAISHALI   Date of discharge: 03/05/2017  Admitting diagnosis: ROM CTX Previous Cesarean Section Intrauterine pregnancy: 4457w0d     Secondary diagnosis:  Principal Problem:   Postpartum care following cesarean delivery (5/10) Active Problems:   Cesarean delivery delivered (5/10), indication: repeat   Rh negative, maternal / Newborn Rh pos      Discharge diagnosis: Term Pregnancy Delivered                                                                                            Post partum procedures:rhogam  Complications: None  Hospital course:  Sceduled C/S   31 y.o. yo G2P2002 at 7057w0d was admitted to the hospital 03/03/2017 cesarean section with the following indication:Elective Repeat.  Membrane Rupture Time/Date: 1:15 AM ,03/03/2017   Patient delivered a Viable infant.03/03/2017  Details of operation can be found in separate operative note.  [name] had an uncomplicated postpartum course.  She is ambulating, tolerating a regular diet, passing flatus, and urinating well. Patient is discharged home in stable condition on  03/05/17         Physical exam  Vitals:   03/04/17 0000 03/04/17 0600 03/04/17 1735 03/05/17 0515  BP: (!) 141/66 133/83 118/67 123/73  Pulse: (!) 51 (!) 54 64 68  Resp: 18 20 18 18   Temp: 98.3 F (36.8 C) 97.9 F (36.6 C) 98.1 F (36.7 C) 97.7 F (36.5 C)  TempSrc: Oral Oral Oral Oral  SpO2:      Weight:      Height:       General: alert, cooperative and no distress Lochia: appropriate Uterine Fundus: firm Incision: Healing well with no significant drainage DVT Evaluation: No cords or calf tenderness. No significant calf/ankle edema. Labs: Lab Results  Component Value Date   WBC 10.3 03/04/2017   HGB 10.0 (L) 03/04/2017   HCT 28.1 (L) 03/04/2017   MCV 90.4 03/04/2017   PLT 161 03/04/2017   CMP  Latest Ref Rng & Units 04/20/2015  Glucose 65 - 99 mg/dL 782(N101(H)  BUN 6 - 20 mg/dL 8  Creatinine 5.620.44 - 1.301.00 mg/dL 8.650.85  Sodium 784135 - 696145 mmol/L 139  Potassium 3.5 - 5.1 mmol/L 3.4(L)  Chloride 101 - 111 mmol/L 107  CO2 22 - 32 mmol/L 26  Calcium 8.9 - 10.3 mg/dL 2.9(B8.7(L)  Total Protein 6.5 - 8.1 g/dL 7.0  Total Bilirubin 0.3 - 1.2 mg/dL 0.3  Alkaline Phos 38 - 126 U/L 79  AST 15 - 41 U/L 17  ALT 14 - 54 U/L 17    Discharge instruction: per After Visit Summary and "Baby and Me Booklet".  After visit meds:  Allergies as of 03/05/2017   No Known Allergies     Medication List    TAKE these medications   acetaminophen 500 MG tablet Commonly known as:  TYLENOL Take 1,000 mg by mouth daily as needed for headache.   CITRANATAL HARMONY 27-1-260 MG Caps Take 1 tablet by mouth at bedtime.  coconut oil Oil Apply 1 application topically as needed.   ferrous sulfate 325 (65 FE) MG tablet Take 325 mg by mouth every other day.   ibuprofen 600 MG tablet Commonly known as:  ADVIL,MOTRIN Take 1 tablet (600 mg total) by mouth every 6 (six) hours.   magnesium oxide 400 (241.3 Mg) MG tablet Commonly known as:  MAG-OX Take 1 tablet (400 mg total) by mouth daily.   oxyCODONE 5 MG immediate release tablet Commonly known as:  Oxy IR/ROXICODONE Take 1 tablet (5 mg total) by mouth every 4 (four) hours as needed (pain scale 4-7).   pantoprazole 20 MG tablet Commonly known as:  PROTONIX Take 20 mg by mouth 2 (two) times daily.   senna-docusate 8.6-50 MG tablet Commonly known as:  Senokot-S Take 2 tablets by mouth daily. Start taking on:  03/06/2017   simethicone 80 MG chewable tablet Commonly known as:  MYLICON Chew 1 tablet (80 mg total) by mouth 3 (three) times daily after meals.       Diet: routine diet  Activity: Advance as tolerated. Pelvic rest for 6 weeks.   Outpatient follow up:6 weeks postpartum visit with [facility]  Postpartum contraception: Not  Discussed  Newborn Data: Live born female Carrie Marsh Birth Weight: 8 lb 0.6 oz (3645 g) APGAR: 9, 10  Baby Feeding: Breast Disposition:home with mother   03/05/2017 Neta Mends, CNM

## 2017-03-09 ENCOUNTER — Encounter (HOSPITAL_COMMUNITY): Payer: Self-pay | Admitting: *Deleted

## 2017-03-09 ENCOUNTER — Inpatient Hospital Stay (HOSPITAL_COMMUNITY)
Admission: AD | Admit: 2017-03-09 | Discharge: 2017-03-11 | DRG: 776 | Disposition: A | Discharge status: Home / Self Care | Payer: BLUE CROSS/BLUE SHIELD | Source: Ambulatory Visit | Attending: Obstetrics and Gynecology | Admitting: Obstetrics and Gynecology

## 2017-03-09 DIAGNOSIS — D6489 Other specified anemias: Secondary | ICD-10-CM | POA: Diagnosis present

## 2017-03-09 DIAGNOSIS — O1415 Severe pre-eclampsia, complicating the puerperium: Principal | ICD-10-CM

## 2017-03-09 DIAGNOSIS — H538 Other visual disturbances: Secondary | ICD-10-CM | POA: Diagnosis not present

## 2017-03-09 DIAGNOSIS — O9081 Anemia of the puerperium: Secondary | ICD-10-CM | POA: Diagnosis not present

## 2017-03-09 DIAGNOSIS — O1495 Unspecified pre-eclampsia, complicating the puerperium: Secondary | ICD-10-CM | POA: Diagnosis present

## 2017-03-09 DIAGNOSIS — O1410 Severe pre-eclampsia, unspecified trimester: Secondary | ICD-10-CM | POA: Diagnosis present

## 2017-03-09 DIAGNOSIS — R51 Headache: Secondary | ICD-10-CM | POA: Diagnosis not present

## 2017-03-09 LAB — COMPREHENSIVE METABOLIC PANEL
ALBUMIN: 2.6 g/dL — AB (ref 3.5–5.0)
ALT: 22 U/L (ref 14–54)
ANION GAP: 7 (ref 5–15)
AST: 21 U/L (ref 15–41)
Alkaline Phosphatase: 115 U/L (ref 38–126)
BILIRUBIN TOTAL: 0.1 mg/dL — AB (ref 0.3–1.2)
BUN: 11 mg/dL (ref 6–20)
CO2: 26 mmol/L (ref 22–32)
Calcium: 8.5 mg/dL — ABNORMAL LOW (ref 8.9–10.3)
Chloride: 109 mmol/L (ref 101–111)
Creatinine, Ser: 0.74 mg/dL (ref 0.44–1.00)
GFR calc non Af Amer: >60 mL/min (ref >60)
GLUCOSE: 90 mg/dL (ref 65–99)
POTASSIUM: 3.7 mmol/L (ref 3.5–5.1)
Sodium: 142 mmol/L (ref 135–145)
TOTAL PROTEIN: 6 g/dL — AB (ref 6.5–8.1)

## 2017-03-09 LAB — CBC
HCT: 30.3 % — ABNORMAL LOW (ref 36.0–46.0)
Hemoglobin: 10.5 g/dL — ABNORMAL LOW (ref 12.0–15.0)
MCH: 31.4 pg (ref 26.0–34.0)
MCHC: 34.7 g/dL (ref 30.0–36.0)
MCV: 90.7 fL (ref 78.0–100.0)
Platelets: 298 10*3/uL (ref 150–400)
RBC: 3.34 MIL/uL — ABNORMAL LOW (ref 3.87–5.11)
RDW: 15.5 % (ref 11.5–15.5)
WBC: 7 10*3/uL (ref 4.0–10.5)

## 2017-03-09 LAB — PROTEIN / CREATININE RATIO, URINE
Creatinine, Urine: 54 mg/dL
PROTEIN CREATININE RATIO: 0.17 mg/mg{creat} — AB (ref 0.00–0.15)
TOTAL PROTEIN, URINE: 9 mg/dL

## 2017-03-09 LAB — URINALYSIS, ROUTINE W REFLEX MICROSCOPIC
BILIRUBIN URINE: NEGATIVE
GLUCOSE, UA: NEGATIVE mg/dL
HGB URINE DIPSTICK: NEGATIVE
KETONES UR: NEGATIVE mg/dL
Leukocytes, UA: NEGATIVE
Nitrite: NEGATIVE
PROTEIN: NEGATIVE mg/dL
Specific Gravity, Urine: 1.012 (ref 1.005–1.030)
pH: 7 (ref 5.0–8.0)

## 2017-03-09 MED ORDER — CYCLOBENZAPRINE HCL 10 MG PO TABS
10.0000 mg | ORAL_TABLET | Freq: Once | ORAL | Status: AC
  Administered 2017-03-09: 10 mg via ORAL
  Filled 2017-03-09: qty 1

## 2017-03-09 MED ORDER — LABETALOL HCL 5 MG/ML IV SOLN: 20.0000 mg | Freq: Once | INTRAVENOUS | Status: DC

## 2017-03-09 MED ORDER — MAGNESIUM SULFATE BOLUS VIA INFUSION
4.0000 g | Freq: Once | INTRAVENOUS | Status: AC
  Administered 2017-03-09: 4 g via INTRAVENOUS
  Filled 2017-03-09: qty 500

## 2017-03-09 MED ORDER — ZOLPIDEM TARTRATE 5 MG PO TABS: 5.0000 mg | ORAL_TABLET | Freq: Every evening | ORAL | Status: DC | PRN

## 2017-03-09 MED ORDER — NIFEDIPINE ER OSMOTIC RELEASE 30 MG PO TB24: 30.0000 mg | ORAL_TABLET | Freq: Once | ORAL | Status: DC

## 2017-03-09 MED ORDER — ACETAMINOPHEN 325 MG PO TABS
650.0000 mg | ORAL_TABLET | ORAL | Status: DC | PRN
  Administered 2017-03-10 (×4): 650 mg via ORAL
  Filled 2017-03-09 (×4): qty 2

## 2017-03-09 MED ORDER — PRENATAL MULTIVITAMIN CH
1.0000 | ORAL_TABLET | Freq: Every day | ORAL | Status: DC
  Administered 2017-03-10: 1 via ORAL
  Filled 2017-03-09: qty 1

## 2017-03-09 MED ORDER — HYDRALAZINE HCL 20 MG/ML IJ SOLN
10.0000 mg | Freq: Once | INTRAMUSCULAR | Status: AC
  Administered 2017-03-09: 10 mg via INTRAMUSCULAR
  Filled 2017-03-09: qty 1

## 2017-03-09 MED ORDER — MAGNESIUM SULFATE 40 G IN LACTATED RINGERS - SIMPLE
2.0000 g/h | INTRAVENOUS | Status: AC
  Administered 2017-03-09 – 2017-03-10 (×2): 2 g/h via INTRAVENOUS
  Filled 2017-03-09: qty 500
  Filled 2017-03-09: qty 40

## 2017-03-09 MED ORDER — CALCIUM CARBONATE ANTACID 500 MG PO CHEW: 2.0000 | CHEWABLE_TABLET | ORAL | Status: DC | PRN

## 2017-03-09 MED ORDER — DOCUSATE SODIUM 100 MG PO CAPS
100.0000 mg | ORAL_CAPSULE | Freq: Every day | ORAL | Status: DC
  Administered 2017-03-10: 100 mg via ORAL
  Filled 2017-03-09: qty 1

## 2017-03-09 MED ORDER — LACTATED RINGERS IV SOLN
INTRAVENOUS | Status: AC
  Administered 2017-03-09 – 2017-03-10 (×3): via INTRAVENOUS

## 2017-03-09 MED ORDER — ACETAMINOPHEN 500 MG PO TABS
1000.0000 mg | ORAL_TABLET | Freq: Once | ORAL | Status: AC
  Administered 2017-03-09: 1000 mg via ORAL
  Filled 2017-03-09: qty 2

## 2017-03-09 NOTE — MAU Provider Note (Signed)
History     CSN: [id number]  Arrival date and time: 03/09/17 1419   None     Chief Complaint  Patient presents with  . Hypertension  . Blurred Vision  . Headache   HPI   [name] is a 31 y.o. female G2P2002 here in MAU with blurred vision, and HA.  Both started yesterday. She is status post cesarean section on 5/10.  She called the nurse line today and was told to check her BP at home. Her BP read 189/93. She took ibuprofen, tylenol and oxycodone for her headache in the last 24 hours and nothing took her HA away.   OB History    Gravida Para Term Preterm AB Living   '2 2 2     2   '$ SAB TAB Ectopic Multiple Live Births         0 2      Past Medical History:  Diagnosis Date  . Dehydration 01/04/2017  . GERD (gastroesophageal reflux disease)   . History of gestational hypertension 01/04/2017  . Ketonuria 01/04/2017  . Oliguria 01/04/2017  . Pregnancy induced hypertension    first pregnancy    Past Surgical History:  Procedure Laterality Date  . CESAREAN SECTION N/A 02/16/2014   Procedure: CESAREAN SECTION;  Surgeon: Allena Katz, MD;  Location: [facility];  Service: Obstetrics;  Laterality: N/A;  . CESAREAN SECTION N/A 03/03/2017   Procedure: Repeat CESAREAN SECTION;  Surgeon: Azucena Fallen, MD;  Location: [facility];  Service: Obstetrics;  Laterality: N/A;  EDD: 03/10/17  . WISDOM TOOTH EXTRACTION      Family History  Problem Relation Age of Onset  . Diabetes Mother   . Rheum arthritis Father   . Diabetes Father   . Cancer Paternal Grandmother     Social History  Substance Use Topics  . Smoking status: Passive Smoke Exposure - Never Smoker  . Smokeless tobacco: Never Used  . Alcohol use No    Allergies: No Known Allergies  Prescriptions Prior to Admission  Medication Sig Dispense Refill Last Dose  . acetaminophen (TYLENOL) 500 MG tablet Take 1,000 mg by mouth daily as needed for headache.    Past Week at Unknown time  . coconut oil OIL  Apply 1 application topically as needed.  0   . ferrous sulfate 325 (65 FE) MG tablet Take 325 mg by mouth every other day.   Past Week at Unknown time  . ibuprofen (ADVIL,MOTRIN) 600 MG tablet Take 1 tablet (600 mg total) by mouth every 6 (six) hours. 30 tablet 0   . magnesium oxide (MAG-OX) 400 (241.3 Mg) MG tablet Take 1 tablet (400 mg total) by mouth daily.     Marland Kitchen oxyCODONE (OXY IR/ROXICODONE) 5 MG immediate release tablet Take 1 tablet (5 mg total) by mouth every 4 (four) hours as needed (pain scale 4-7). 30 tablet 0   . pantoprazole (PROTONIX) 20 MG tablet Take 20 mg by mouth 2 (two) times daily.   03/02/2017 at Unknown time  . Prenat-FeFmCb-DSS-FA-DHA w/o A (CITRANATAL HARMONY) 27-1-260 MG CAPS Take 1 tablet by mouth at bedtime.   10 03/02/2017 at Unknown time  . senna-docusate (SENOKOT-S) 8.6-50 MG tablet Take 2 tablets by mouth daily.     . simethicone (MYLICON) 80 MG chewable tablet Chew 1 tablet (80 mg total) by mouth 3 (three) times daily after meals. 30 tablet 0    Results for orders placed or performed during the hospital encounter of 03/09/17 (from  the past 48 hour(s))  Urinalysis, Routine w reflex microscopic     Status: Abnormal   Collection Time: 03/09/17  2:45 PM  Result Value Ref Range   Color, Urine STRAW (A) YELLOW   APPearance CLEAR CLEAR   Specific Gravity, Urine 1.012 1.005 - 1.030   pH 7.0 5.0 - 8.0   Glucose, UA NEGATIVE NEGATIVE mg/dL   Hgb urine dipstick NEGATIVE NEGATIVE   Bilirubin Urine NEGATIVE NEGATIVE   Ketones, ur NEGATIVE NEGATIVE mg/dL   Protein, ur NEGATIVE NEGATIVE mg/dL   Nitrite NEGATIVE NEGATIVE   Leukocytes, UA NEGATIVE NEGATIVE  Protein / creatinine ratio, urine     Status: Abnormal   Collection Time: 03/09/17  2:45 PM  Result Value Ref Range   Creatinine, Urine 54.00 mg/dL   Total Protein, Urine 9 mg/dL    Comment: NO NORMAL RANGE ESTABLISHED FOR THIS TEST   Protein Creatinine Ratio 0.17 (H) 0.00 - 0.15 mg/mg[Cre]  CBC     Status: Abnormal    Collection Time: 03/09/17  2:57 PM  Result Value Ref Range   WBC 7.0 4.0 - 10.5 K/uL   RBC 3.34 (L) 3.87 - 5.11 MIL/uL   Hemoglobin 10.5 (L) 12.0 - 15.0 g/dL   HCT 30.3 (L) 36.0 - 46.0 %   MCV 90.7 78.0 - 100.0 fL   MCH 31.4 26.0 - 34.0 pg   MCHC 34.7 30.0 - 36.0 g/dL   RDW 15.5 11.5 - 15.5 %   Platelets 298 150 - 400 K/uL  Comprehensive metabolic panel     Status: Abnormal   Collection Time: 03/09/17  2:57 PM  Result Value Ref Range   Sodium 142 135 - 145 mmol/L   Potassium 3.7 3.5 - 5.1 mmol/L   Chloride 109 101 - 111 mmol/L   CO2 26 22 - 32 mmol/L   Glucose, Bld 90 65 - 99 mg/dL   BUN 11 6 - 20 mg/dL   Creatinine, Ser 0.74 0.44 - 1.00 mg/dL   Calcium 8.5 (L) 8.9 - 10.3 mg/dL   Total Protein 6.0 (L) 6.5 - 8.1 g/dL   Albumin 2.6 (L) 3.5 - 5.0 g/dL   AST 21 15 - 41 U/L   ALT 22 14 - 54 U/L   Alkaline Phosphatase 115 38 - 126 U/L   Total Bilirubin 0.1 (L) 0.3 - 1.2 mg/dL   GFR calc non Af Amer >60 >60 mL/min   GFR calc Af Amer >60 >60 mL/min    Comment: (NOTE) The eGFR has been calculated using the CKD EPI equation. This calculation has not been validated in all clinical situations. eGFR's persistently <60 mL/min signify possible Chronic Kidney Disease.    Anion gap 7 5 - 15   Review of Systems  Eyes: Positive for photophobia and visual disturbance.  Gastrointestinal: Negative for abdominal pain.  Neurological: Positive for headaches.   Physical Exam   Blood pressure (!) 167/83, pulse (!) 56, temperature 98.9 F (37.2 C), resp. rate 16, weight 173 lb (78.5 kg), SpO2 97 %, unknown if currently breastfeeding.   Patient Vitals for the past 24 hrs:  BP Temp Pulse Resp SpO2 Weight  03/09/17 1633 (!) 162/89 - (!) 59 - - -  03/09/17 1632 - - - - 100 % -  03/09/17 1615 (!) 168/84 - (!) 48 - - -  03/09/17 1600 (!) 149/78 - (!) 51 - - -  03/09/17 1545 (!) 152/72 - (!) 58 - - -  03/09/17 1515 (!) 178/84 - (!) 48 - - -  03/09/17 1500 (!) 166/87 - (!) 50 - - -  03/09/17  1449 (!) 167/83 - - - - -  03/09/17 1443 - - - - 97 % -  03/09/17 1442 (!) 168/86 98.9 F (37.2 C) (!) 56 16 - 173 lb (78.5 kg)   Physical Exam  Constitutional: She is oriented to person, place, and time. She appears well-developed and well-nourished. No distress.  HENT:  Head: Normocephalic.  Eyes: Pupils are equal, round, and reactive to light.  Cardiovascular: Normal rate.   Respiratory: Effort normal and breath sounds normal.  GI: Soft. She exhibits no distension. There is no tenderness.  Musculoskeletal: Normal range of motion. She exhibits edema.  Neurological: She is alert and oriented to person, place, and time. She displays abnormal reflex.  Reflex Scores:      Patellar reflexes are 3+ on the right side and 3+ on the left side. Negative clonus   Skin: Skin is warm. She is not diaphoretic.  Psychiatric: Her behavior is normal.    MAU Course  Procedures None  MDM  PIH labs  Preeclampsia protocol; labetalol held HR 48 which patient says is normal for her.  10 mg IM hydralazine Flexeril 10 mg PO & 1000 mg tylenol given.  No relief of HA from pain medication.   Discussed patient with Dr. Murrell Redden who will admit the patient.   Assessment and Plan    A:  1. Hypertension in pregnancy, preeclampsia, severe, postpartum condition     P:  Admit to high risk  Magnesium ordered     Lezlie Lye, NP 03/09/2017 5:34 PM

## 2017-03-09 NOTE — MAU Note (Signed)
Pt sent from Dr Jorene Minorsaavon's office increase in blood pressure. Pt states she started having blurred vision and headache yesterday. Delivered via C/S on May the 10th

## 2017-03-09 NOTE — H&P (Addendum)
Carrie Marsh is an 31 y.o. female G2P2 6 days s/p rltcs at term.  She presents with c/o severe headache and difficulty focusing/blurred vision for the last 24 hrs.  She checked bp at home and was 190-200/90s - called office and was sent in for evaluation.  She has taken oxycodone, ibuprofen and tylenol for her h/a w/o relief.  She also c/o increased swelling since delivery.  She denies any abdominal pain and has only been taking her narcotic about q day.  She denies n/v/sob/cp.  Vaginal bleeding is normal.  Antepartum bps wnl, but pt does have h/o severe pre-e with first pregnancy and was on mag sulfate.  The patient was given hydralazine 10mg  for her severe range bp during initial evaluation and also given flexaril and 1000mg  tylenol.  She says that her h/a went from a 6-7 now to a 4 - declines further management for her h/a at this time.        Past Medical History:  Diagnosis Date  . Dehydration 01/04/2017  . GERD (gastroesophageal reflux disease)   . History of gestational hypertension 01/04/2017  . Ketonuria 01/04/2017  . Oliguria 01/04/2017  . Pregnancy induced hypertension    first pregnancy    Past Surgical History:  Procedure Laterality Date  . CESAREAN SECTION N/A 02/16/2014   Procedure: CESAREAN SECTION;  Surgeon: Leslie AndreaJames E Tomblin II, MD;  Location: [facility];  Service: Obstetrics;  Laterality: N/A;  . CESAREAN SECTION N/A 03/03/2017   Procedure: Repeat CESAREAN SECTION;  Surgeon: Shea EvansMody, Vaishali, MD;  [facility];  Service: Obstetrics;  Laterality: N/A;  EDD: 03/10/17  . WISDOM TOOTH EXTRACTION      Family History  Problem Relation Age of Onset  . Diabetes Mother   . Rheum arthritis Father   . Diabetes Father   . Cancer Paternal Grandmother     Social History:  reports that she is a non-smoker but has been exposed to tobacco smoke. She has never used smokeless tobacco. She reports that she does not drink alcohol or use drugs.  Allergies: No Known  Allergies  Prescriptions Prior to Admission  Medication Sig Dispense Refill Last Dose  . acetaminophen (TYLENOL) 500 MG tablet Take 1,000 mg by mouth daily as needed for headache.    03/09/2017 at Unknown time  . ferrous sulfate 325 (65 FE) MG tablet Take 325 mg by mouth every other day.   Past Week at Unknown time  . ibuprofen (ADVIL,MOTRIN) 600 MG tablet Take 1 tablet (600 mg total) by mouth every 6 (six) hours. 30 tablet 0 03/09/2017 at Unknown time  . magnesium oxide (MAG-OX) 400 (241.3 Mg) MG tablet Take 1 tablet (400 mg total) by mouth daily.   03/09/2017 at Unknown time  . oxyCODONE (OXY IR/ROXICODONE) 5 MG immediate release tablet Take 1 tablet (5 mg total) by mouth every 4 (four) hours as needed (pain scale 4-7). 30 tablet 0 03/09/2017 at Unknown time  . pantoprazole (PROTONIX) 20 MG tablet Take 20 mg by mouth 2 (two) times daily.   03/08/2017 at Unknown time  . Prenat-FeFmCb-DSS-FA-DHA w/o A (CITRANATAL HARMONY) 27-1-260 MG CAPS Take 1 tablet by mouth at bedtime.   10 03/08/2017 at Unknown time  . senna-docusate (SENOKOT-S) 8.6-50 MG tablet Take 2 tablets by mouth daily.   03/08/2017 at Unknown time  . simethicone (MYLICON) 80 MG chewable tablet Chew 1 tablet (80 mg total) by mouth 3 (three) times daily after meals. 30 tablet 0 03/08/2017 at Unknown time  .  coconut oil OIL Apply 1 application topically as needed. (Patient not taking: Reported on 03/09/2017)  0 Not Taking at Unknown time    Review of Systems  Eyes: Positive for double vision.    Blood pressure (!) 151/76, pulse (!) 57, temperature 98.6 F (37 C), temperature source Oral, resp. rate 18, weight 78.5 kg (173 lb), SpO2 100 %, unknown if currently breastfeeding. Physical Exam  Vitals reviewed. Constitutional: She is oriented to person, place, and time. She appears well-developed and well-nourished.  HENT:  Head: Normocephalic and atraumatic.  Cardiovascular: Normal rate and regular rhythm.   Respiratory: Effort normal and  breath sounds normal.  GI: Soft. She exhibits no distension. There is no tenderness.  Inc: healing well, no e/d/i, steristrips intact  Musculoskeletal: She exhibits edema.  3+ pitting edema LE bilat; nt bilat; scds placed  Neurological: She is alert and oriented to person, place, and time. She has normal reflexes.  Skin: Skin is warm and dry.  Psychiatric: She has a normal mood and affect.    Results for orders placed or performed during the hospital encounter of 03/09/17 (from the past 24 hour(s))  Urinalysis, Routine w reflex microscopic     Status: Abnormal   Collection Time: 03/09/17  2:45 PM  Result Value Ref Range   Color, Urine STRAW (A) YELLOW   APPearance CLEAR CLEAR   Specific Gravity, Urine 1.012 1.005 - 1.030   pH 7.0 5.0 - 8.0   Glucose, UA NEGATIVE NEGATIVE mg/dL   Hgb urine dipstick NEGATIVE NEGATIVE   Bilirubin Urine NEGATIVE NEGATIVE   Ketones, ur NEGATIVE NEGATIVE mg/dL   Protein, ur NEGATIVE NEGATIVE mg/dL   Nitrite NEGATIVE NEGATIVE   Leukocytes, UA NEGATIVE NEGATIVE  Protein / creatinine ratio, urine     Status: Abnormal   Collection Time: 03/09/17  2:45 PM  Result Value Ref Range   Creatinine, Urine 54.00 mg/dL   Total Protein, Urine 9 mg/dL   Protein Creatinine Ratio 0.17 (H) 0.00 - 0.15 mg/mg[Cre]  CBC     Status: Abnormal   Collection Time: 03/09/17  2:57 PM  Result Value Ref Range   WBC 7.0 4.0 - 10.5 K/uL   RBC 3.34 (L) 3.87 - 5.11 MIL/uL   Hemoglobin 10.5 (L) 12.0 - 15.0 g/dL   HCT 16.1 (L) 09.6 - 04.5 %   MCV 90.7 78.0 - 100.0 fL   MCH 31.4 26.0 - 34.0 pg   MCHC 34.7 30.0 - 36.0 g/dL   RDW 40.9 81.1 - 91.4 %   Platelets 298 150 - 400 K/uL  Comprehensive metabolic panel     Status: Abnormal   Collection Time: 03/09/17  2:57 PM  Result Value Ref Range   Sodium 142 135 - 145 mmol/L   Potassium 3.7 3.5 - 5.1 mmol/L   Chloride 109 101 - 111 mmol/L   CO2 26 22 - 32 mmol/L   Glucose, Bld 90 65 - 99 mg/dL   BUN 11 6 - 20 mg/dL   Creatinine, Ser  7.82 0.44 - 1.00 mg/dL   Calcium 8.5 (L) 8.9 - 10.3 mg/dL   Total Protein 6.0 (L) 6.5 - 8.1 g/dL   Albumin 2.6 (L) 3.5 - 5.0 g/dL   AST 21 15 - 41 U/L   ALT 22 14 - 54 U/L   Alkaline Phosphatase 115 38 - 126 U/L   Total Bilirubin 0.1 (L) 0.3 - 1.2 mg/dL   GFR calc non Af Amer >60 >60 mL/min   GFR calc Af Amer >  60 >60 mL/min   Anion gap 7 5 - 15    No results found.  Assessment/Plan: 1.  Severe per-eclalmpsia by bps and severe h/a - plan mag sulfate 4 G bolus then 2G q hr; bp ranges 151-168/70s-90s; follow closely, s/p hydralazine x1 and appear to be trending down, now mild range - follow closely to see if scheduled oral anti-hypertensive needed; nml labs with mild anemia, improved from discharge; strict I/o's; reviewed diagnosis and plan with patient and her husband, questions answered 2. Post-op: healing well, oxycodone prn pain; normal lochia; nursing - will get breast pump 3. Chronic mild anemia - was taking iron, improved, plan contin with d/c home  Vick Frees 03/09/2017, 6:29 PM

## 2017-03-10 ENCOUNTER — Encounter (HOSPITAL_COMMUNITY): Payer: Self-pay | Admitting: *Deleted

## 2017-03-10 DIAGNOSIS — D6489 Other specified anemias: Secondary | ICD-10-CM | POA: Diagnosis present

## 2017-03-10 DIAGNOSIS — R51 Headache: Secondary | ICD-10-CM | POA: Diagnosis present

## 2017-03-10 DIAGNOSIS — O1495 Unspecified pre-eclampsia, complicating the puerperium: Secondary | ICD-10-CM | POA: Diagnosis present

## 2017-03-10 DIAGNOSIS — O1415 Severe pre-eclampsia, complicating the puerperium: Secondary | ICD-10-CM | POA: Diagnosis not present

## 2017-03-10 DIAGNOSIS — O9081 Anemia of the puerperium: Secondary | ICD-10-CM | POA: Diagnosis present

## 2017-03-10 MED ORDER — SODIUM CHLORIDE 0.9% FLUSH
3.0000 mL | Freq: Two times a day (BID) | INTRAVENOUS | Status: DC
  Administered 2017-03-10: 3 mL via INTRAVENOUS

## 2017-03-10 NOTE — Progress Notes (Signed)
Pt sleeping; awoke with exam - mild h/a off/on, taking tylenol prn; still says that vision is blurred - seeing double in right eye: had this last time on magnesium sulfate with previous pregnancy; no sob/cp/n/v, no abdominal pain  BP (!) 141/80 (BP Location: Right Arm)   Pulse (!) 56   Temp 98.2 F (36.8 C) (Oral)   Resp 18   Ht 5\' 2"  (1.575 m)   Wt 78.5 kg (173 lb)   SpO2 98%   BMI 31.64 kg/m  Bps range 120s-140s/69-80s   Intake/Output Summary (Last 24 hours) at 03/10/17 1059 Last data filed at 03/10/17 0853  Gross per 24 hour  Intake             1425 ml  Output             6150 ml  Net            -4725 ml    A&ox3 rrr ctab abd: soft, nt, nd; inc: no e/d/I LE: trace to +1 LE and NT; bilat feet with +edema dtr nml reflexes   A/p: 1. Severe pre-e: nml labs with admission - contin magnesium sulfate for 24 hrs (d/c 1800), then d/c foley and will increase ambulation to ad lib; no bp meds, but will follow overnight; no s/sx toxicity; very good diuresis; follow closely 2. Post - op: doing well, pumping

## 2017-03-11 NOTE — Discharge Instructions (Signed)

## 2017-03-11 NOTE — Discharge Summary (Signed)
Carrie Marsh MRN: [id number] DOB/AGE: 31/10/1985 31 y.o.  Admit date: 03/09/2017 Discharge date: 03/11/17  Admission Diagnoses: PP, HIGH BP,HA,BLURRY VISION- severe PEC  Discharge Diagnoses: PP, HIGH BP,HA,BLURRY VISION Severe PEC, resolved        Active Problems:   Severe pre-eclampsia   Preeclampsia in postpartum period   Discharged Condition: good  Hospital Course: Admitted with high bp and HA/ vision change. Received 24 hrs magnesium, only needed 1 time stat meds, no antihypertensives since admission. No abnormalities on labs.   On day of d/c. Pt notes no pain, no HA, tol reg po, no N/V. Pain controlled with tylenol/ Motrin. No VB. No RUQ pain. Pt notes no symptoms and read for d/c.   PE: Vitals:   03/10/17 2118 03/10/17 2349 03/11/17 0440 03/11/17 0700  BP: (!) 141/88 (!) 141/93 134/82 133/74  Pulse: 66 79 63 66  Resp: 18 18 18 18   Temp: 97.7 F (36.5 C) 98.2 F (36.8 C) 98.4 F (36.9 C) 98.5 F (36.9 C)  TempSrc: Oral Oral Oral Oral  SpO2: 98% 97% 96% 98%  Weight:      Height:       CV: RRR Pulm: CTAB Gen: well appearing,no distress Abd: soft, appropriately tender. No RUQ pain, fundus firm and umblicus Inc: C/D/I LE: trrace pedal edema, no leg edema, 2+ DTR, no clonus  A/P: resolved severe PEC, [pt understands symptoms of PEC. No indication for bp meds. Pt to take bid bp at home and f/u in office in 1 wk.    Consults: None  Treatments: Magnesium  Disposition: 01-Home or Self Care   Allergies as of 03/11/2017   No Known Allergies     Medication List    TAKE these medications   acetaminophen 500 MG tablet Commonly known as:  TYLENOL Take 1,000 mg by mouth daily as needed for headache.   CITRANATAL HARMONY 27-1-260 MG Caps Take 1 tablet by mouth at bedtime.   coconut oil Oil Apply 1 application topically as needed.   ferrous sulfate 325 (65 FE) MG tablet Take 325 mg by mouth every other day.   ibuprofen 600 MG tablet Commonly known as:   ADVIL,MOTRIN Take 1 tablet (600 mg total) by mouth every 6 (six) hours.   magnesium oxide 400 (241.3 Mg) MG tablet Commonly known as:  MAG-OX Take 1 tablet (400 mg total) by mouth daily.   oxyCODONE 5 MG immediate release tablet Commonly known as:  Oxy IR/ROXICODONE Take 1 tablet (5 mg total) by mouth every 4 (four) hours as needed (pain scale 4-7).   pantoprazole 20 MG tablet Commonly known as:  PROTONIX Take 20 mg by mouth 2 (two) times daily.   senna-docusate 8.6-50 MG tablet Commonly known as:  Senokot-S Take 2 tablets by mouth daily.   simethicone 80 MG chewable tablet Commonly known as:  MYLICON Chew 1 tablet (80 mg total) by mouth 3 (three) times daily after meals.        Signed: Lendon ColonelFOGLEMAN,Harmoney Sienkiewicz A., MD 03/11/2017, 9:03 AM

## 2017-03-18 DIAGNOSIS — O135 Gestational [pregnancy-induced] hypertension without significant proteinuria, complicating the puerperium: Secondary | ICD-10-CM | POA: Diagnosis not present

## 2017-05-05 NOTE — Anesthesia Postprocedure Evaluation (Signed)
Anesthesia Post Note  Patient: Carrie ColderHeather C Marsh  Procedure(s) Performed: Procedure(s) (LRB): Repeat CESAREAN SECTION (N/A)     Anesthesia Post Evaluation  Last Vitals:  Vitals:   03/04/17 1735 03/05/17 0515  BP: 118/67 123/73  Pulse: 64 68  Resp: 18 18  Temp: 36.7 C 36.5 C    Last Pain:  Vitals:   03/05/17 0830  TempSrc:   PainSc: 5                  Phillips Groutarignan, Damyn Weitzel

## 2017-05-05 NOTE — Addendum Note (Signed)
Addendum  created 05/05/17 1433 by Graziella Connery, MD   Sign clinical note    

## 2017-06-16 DIAGNOSIS — Z6829 Body mass index (BMI) 29.0-29.9, adult: Secondary | ICD-10-CM | POA: Diagnosis not present

## 2017-06-16 DIAGNOSIS — J0101 Acute recurrent maxillary sinusitis: Secondary | ICD-10-CM | POA: Diagnosis not present

## 2017-06-28 DIAGNOSIS — J0101 Acute recurrent maxillary sinusitis: Secondary | ICD-10-CM | POA: Diagnosis not present

## 2017-06-28 DIAGNOSIS — Z6828 Body mass index (BMI) 28.0-28.9, adult: Secondary | ICD-10-CM | POA: Diagnosis not present

## 2017-07-05 DIAGNOSIS — H52221 Regular astigmatism, right eye: Secondary | ICD-10-CM | POA: Diagnosis not present

## 2017-07-05 DIAGNOSIS — H5213 Myopia, bilateral: Secondary | ICD-10-CM | POA: Diagnosis not present

## 2017-08-08 DIAGNOSIS — F53 Postpartum depression: Secondary | ICD-10-CM | POA: Diagnosis not present

## 2017-09-06 DIAGNOSIS — F53 Postpartum depression: Secondary | ICD-10-CM | POA: Diagnosis not present

## 2017-09-06 DIAGNOSIS — Z23 Encounter for immunization: Secondary | ICD-10-CM | POA: Diagnosis not present

## 2017-12-14 ENCOUNTER — Other Ambulatory Visit: Payer: BLUE CROSS/BLUE SHIELD

## 2018-09-16 ENCOUNTER — Other Ambulatory Visit: Payer: Self-pay

## 2018-09-16 ENCOUNTER — Emergency Department: Payer: BLUE CROSS/BLUE SHIELD

## 2018-09-16 ENCOUNTER — Emergency Department
Admission: EM | Admit: 2018-09-16 | Discharge: 2018-09-17 | Disposition: A | Discharge status: Home / Self Care | Payer: BLUE CROSS/BLUE SHIELD | Attending: Emergency Medicine | Admitting: Emergency Medicine

## 2018-09-16 DIAGNOSIS — R1010 Upper abdominal pain, unspecified: Secondary | ICD-10-CM | POA: Diagnosis not present

## 2018-09-16 DIAGNOSIS — R1012 Left upper quadrant pain: Secondary | ICD-10-CM | POA: Diagnosis not present

## 2018-09-16 DIAGNOSIS — K8020 Calculus of gallbladder without cholecystitis without obstruction: Secondary | ICD-10-CM

## 2018-09-16 DIAGNOSIS — N390 Urinary tract infection, site not specified: Secondary | ICD-10-CM | POA: Diagnosis not present

## 2018-09-16 DIAGNOSIS — K8051 Calculus of bile duct without cholangitis or cholecystitis with obstruction: Secondary | ICD-10-CM | POA: Diagnosis not present

## 2018-09-16 DIAGNOSIS — Z7722 Contact with and (suspected) exposure to environmental tobacco smoke (acute) (chronic): Secondary | ICD-10-CM | POA: Insufficient documentation

## 2018-09-16 DIAGNOSIS — R030 Elevated blood-pressure reading, without diagnosis of hypertension: Secondary | ICD-10-CM | POA: Diagnosis not present

## 2018-09-16 DIAGNOSIS — M533 Sacrococcygeal disorders, not elsewhere classified: Secondary | ICD-10-CM | POA: Diagnosis not present

## 2018-09-16 DIAGNOSIS — R079 Chest pain, unspecified: Secondary | ICD-10-CM | POA: Diagnosis not present

## 2018-09-16 DIAGNOSIS — Z79899 Other long term (current) drug therapy: Secondary | ICD-10-CM | POA: Insufficient documentation

## 2018-09-16 DIAGNOSIS — R1011 Right upper quadrant pain: Secondary | ICD-10-CM

## 2018-09-16 DIAGNOSIS — K8050 Calculus of bile duct without cholangitis or cholecystitis without obstruction: Secondary | ICD-10-CM

## 2018-09-16 LAB — TROPONIN I: Troponin I: <0.03 ng/mL (ref <0.03)

## 2018-09-16 LAB — POCT PREGNANCY, URINE: PREG TEST UR: NEGATIVE

## 2018-09-16 LAB — CBC
HCT: 40.9 % (ref 36.0–46.0)
Hemoglobin: 14.4 g/dL (ref 12.0–15.0)
MCH: 30.6 pg (ref 26.0–34.0)
MCHC: 35.2 g/dL (ref 30.0–36.0)
MCV: 86.8 fL (ref 80.0–100.0)
PLATELETS: 305 10*3/uL (ref 150–400)
RBC: 4.71 MIL/uL (ref 3.87–5.11)
RDW: 12.3 % (ref 11.5–15.5)
WBC: 8.5 10*3/uL (ref 4.0–10.5)
nRBC: 0 % (ref 0.0–0.2)

## 2018-09-16 LAB — COMPREHENSIVE METABOLIC PANEL
ALBUMIN: 4.3 g/dL (ref 3.5–5.0)
ALK PHOS: 101 U/L (ref 38–126)
ALT: 32 U/L (ref 0–44)
AST: 45 U/L — ABNORMAL HIGH (ref 15–41)
Anion gap: 7 (ref 5–15)
BUN: 11 mg/dL (ref 6–20)
CALCIUM: 9 mg/dL (ref 8.9–10.3)
CO2: 27 mmol/L (ref 22–32)
CREATININE: 0.68 mg/dL (ref 0.44–1.00)
Chloride: 106 mmol/L (ref 98–111)
GFR calc Af Amer: >60 mL/min (ref >60)
GFR calc non Af Amer: >60 mL/min (ref >60)
GLUCOSE: 118 mg/dL — AB (ref 70–99)
Potassium: 3.7 mmol/L (ref 3.5–5.1)
SODIUM: 140 mmol/L (ref 135–145)
Total Bilirubin: 0.5 mg/dL (ref 0.3–1.2)
Total Protein: 7.4 g/dL (ref 6.5–8.1)

## 2018-09-16 LAB — LIPASE, BLOOD: Lipase: 51 U/L (ref 11–51)

## 2018-09-16 LAB — URINALYSIS, COMPLETE (UACMP) WITH MICROSCOPIC
BILIRUBIN URINE: NEGATIVE
Glucose, UA: NEGATIVE mg/dL
Ketones, ur: 5 mg/dL — AB
Nitrite: NEGATIVE
PH: 6 (ref 5.0–8.0)
Protein, ur: 30 mg/dL — AB
SPECIFIC GRAVITY, URINE: 1.03 (ref 1.005–1.030)

## 2018-09-16 MED ORDER — FAMOTIDINE IN NACL 20-0.9 MG/50ML-% IV SOLN
20.0000 mg | Freq: Once | INTRAVENOUS | Status: AC
  Administered 2018-09-17: 20 mg via INTRAVENOUS
  Filled 2018-09-16: qty 50

## 2018-09-16 MED ORDER — SODIUM CHLORIDE 0.9 % IV BOLUS
500.0000 mL | Freq: Once | INTRAVENOUS | Status: AC
  Administered 2018-09-17: 01:00:00 via INTRAVENOUS

## 2018-09-16 MED ORDER — ONDANSETRON HCL 4 MG/2ML IJ SOLN
4.0000 mg | Freq: Once | INTRAMUSCULAR | Status: AC
  Administered 2018-09-17: 4 mg via INTRAVENOUS
  Filled 2018-09-16: qty 2

## 2018-09-16 MED ORDER — ONDANSETRON 4 MG PO TBDP: 4.0000 mg | ORAL_TABLET | Freq: Once | ORAL | Status: DC

## 2018-09-16 NOTE — ED Triage Notes (Signed)
Patient reports abdominal pain that starts left upper quad that radiates to right upper quad, around to back and upward after eating.  Reports last night after eating chicken nuggets (fried) and hamburger tonight.

## 2018-09-16 NOTE — ED Notes (Signed)
In to draw pt's blood; she says the pain is intermittent and mostly on the whole left side of her torso; intermittent pain radiates across to right; sometimes the pain radiates into her chest and into her back; nausea with pain; no vomiting; pt says she's normally constipated but nothing worse than her usual;

## 2018-09-16 NOTE — ED Provider Notes (Signed)
[facility] Emergency Department Provider Note   ____________________________________________   First MD Initiated Contact with Patient 09/16/18 2307     (approximate)  I have reviewed the triage vital signs and the nursing notes.   HISTORY  Chief Complaint Abdominal Pain     HPI Carrie Marsh is a 32 y.o. female who presents to the ED from home with a chief complaint of abdominal pain.  Patient reports a 2 night history of left upper quadrant abdominal pain which radiates to her flank as well as the right upper quadrant.  Pain occurred last evening after she ate chicken nuggets.  Pain reoccurred tonight after eating a hamburger.  Symptoms associated with nausea, no vomiting.  Patient also notes she has had a one-week history of nontraumatic coccyx pain.  States she sits in a computer chair for hours each day.  Denies associated fever, chills, chest pain, shortness of breath, diarrhea.  Denies recent travel or trauma.   Past Medical History:  Diagnosis Date  . Dehydration 01/04/2017  . GERD (gastroesophageal reflux disease)   . History of gestational hypertension 01/04/2017  . Ketonuria 01/04/2017  . Oliguria 01/04/2017  . Pregnancy induced hypertension    first pregnancy    Patient Active Problem List   Diagnosis Date Noted  . Preeclampsia in postpartum period 03/10/2017  . Severe pre-eclampsia 03/09/2017  . Rh negative, maternal / Newborn Rh pos 03/05/2017  . Cesarean delivery delivered (5/10), indication: repeat 03/03/2017  . Postpartum care following cesarean delivery (5/10) 03/03/2017  . Oliguria 01/04/2017  . Dehydration 01/04/2017  . Ketonuria 01/04/2017  . History of gestational hypertension 01/04/2017  . Preeclampsia 02/15/2014    Past Surgical History:  Procedure Laterality Date  . CESAREAN SECTION N/A 02/16/2014   Procedure: CESAREAN SECTION;  Surgeon: Leslie Andrea, MD;  Location: [facility] ORS;  Service: Obstetrics;  Laterality:  N/A;  . CESAREAN SECTION N/A 03/03/2017   Procedure: Repeat CESAREAN SECTION;  Surgeon: Shea Evans, MD;  Location: [facility] BIRTHING SUITES;  Service: Obstetrics;  Laterality: N/A;  EDD: 03/10/17  . WISDOM TOOTH EXTRACTION      Prior to Admission medications   Medication Sig Start Date End Date Taking? Authorizing Provider  acetaminophen (TYLENOL) 500 MG tablet Take 1,000 mg by mouth daily as needed for headache.     [provider]  coconut oil OIL Apply 1 application topically as needed. Patient not taking: Reported on 03/09/2017 03/05/17   Neta Mends, CNM  ferrous sulfate 325 (65 FE) MG tablet Take 325 mg by mouth every other day.    [provider]  ibuprofen (ADVIL,MOTRIN) 600 MG tablet Take 1 tablet (600 mg total) by mouth every 6 (six) hours. 03/05/17   Neta Mends, CNM  magnesium oxide (MAG-OX) 400 (241.3 Mg) MG tablet Take 1 tablet (400 mg total) by mouth daily. 03/05/17   Neta Mends, CNM  oxyCODONE (OXY IR/ROXICODONE) 5 MG immediate release tablet Take 1 tablet (5 mg total) by mouth every 4 (four) hours as needed (pain scale 4-7). 03/05/17   Neta Mends, CNM  pantoprazole (PROTONIX) 20 MG tablet Take 20 mg by mouth 2 (two) times daily.    [provider]  Prenat-FeFmCb-DSS-FA-DHA w/o A (CITRANATAL HARMONY) 27-1-260 MG CAPS Take 1 tablet by mouth at bedtime.  01/01/17   [provider]  senna-docusate (SENOKOT-S) 8.6-50 MG tablet Take 2 tablets by mouth daily. 03/06/17   Neta Mends, CNM  simethicone (MYLICON) 80 MG  chewable tablet Chew 1 tablet (80 mg total) by mouth 3 (three) times daily after meals. 03/05/17   Neta MendsPaul, Daniela C, CNM    Allergies Patient has no known allergies.  Family History  Problem Relation Age of Onset  . Diabetes Mother   . Rheum arthritis Father   . Diabetes Father   . Cancer Paternal Grandmother     Social History Social History   Tobacco Use  . Smoking status: Passive Smoke Exposure - Never Smoker  .  Smokeless tobacco: Never Used  Substance Use Topics  . Alcohol use: No  . Drug use: No    Review of Systems  Constitutional: No fever/chills Eyes: No visual changes. ENT: No sore throat. Cardiovascular: Denies chest pain. Respiratory: Denies shortness of breath. Gastrointestinal: Positive for abdominal pain and nausea, no vomiting.  No diarrhea.  No constipation. Genitourinary: Negative for dysuria. Musculoskeletal: Negative for back pain. Skin: Negative for rash. Neurological: Negative for headaches, focal weakness or numbness.   ____________________________________________   PHYSICAL EXAM:  VITAL SIGNS: ED Triage Vitals  Enc Vitals Group     BP 09/16/18 2014 (!) 155/104     Pulse Rate 09/16/18 2014 66     Resp 09/16/18 2014 18     Temp 09/16/18 2014 98.2 F (36.8 C)     Temp Source 09/16/18 2014 Oral     SpO2 09/16/18 2014 99 %     Weight 09/16/18 2015 168 lb (76.2 kg)     Height 09/16/18 2015 5\' 3"  (1.6 m)     Head Circumference --      Peak Flow --      Pain Score 09/16/18 2015 7     Pain Loc --      Pain Edu? --      Excl. in GC? --     Constitutional: Alert and oriented. Well appearing and in no acute distress. Eyes: Conjunctivae are normal. PERRL. EOMI. Head: Atraumatic. Nose: No congestion/rhinnorhea. Mouth/Throat: Mucous membranes are moist.  Oropharynx non-erythematous. Neck: No stridor.   Cardiovascular: Normal rate, regular rhythm. Grossly normal heart sounds.  Good peripheral circulation. Respiratory: Normal respiratory effort.  No retractions. Lungs CTAB. Gastrointestinal: Soft and mildly tender to palpation upper abdomen without rebound or guarding. No distention. No abdominal bruits. No CVA tenderness. Musculoskeletal: No spinal tenderness to palpation.  Left upper buttock pain adjacent to coccyx.  Full range of motion without pain.  No lower extremity tenderness nor edema.  No joint effusions. Neurologic:  Normal speech and language. No gross  focal neurologic deficits are appreciated. No gait instability. Skin:  Skin is warm, dry and intact. No rash noted. Psychiatric: Mood and affect are normal. Speech and behavior are normal.  ____________________________________________   LABS (all labs ordered are listed, but only abnormal results are displayed)  Labs Reviewed  COMPREHENSIVE METABOLIC PANEL - Abnormal; Notable for the following components:      Result Value   Glucose, Bld 118 (*)    AST 45 (*)    All other components within normal limits  URINALYSIS, COMPLETE (UACMP) WITH MICROSCOPIC - Abnormal; Notable for the following components:   Color, Urine YELLOW (*)    APPearance HAZY (*)    Hgb urine dipstick SMALL (*)    Ketones, ur 5 (*)    Protein, ur 30 (*)    Leukocytes, UA TRACE (*)    Bacteria, UA RARE (*)    All other components within normal limits  LIPASE, BLOOD  CBC  TROPONIN I  POC URINE PREG, ED  POCT PREGNANCY, URINE   ____________________________________________  EKG  ED ECG REPORT I, SUNG,JADE J, the attending physician, personally viewed and interpreted this ECG.   Date: 09/17/2018  EKG Time: 2042   Rate: 70  Rhythm: normal EKG, normal sinus rhythm  Axis: Normal  Intervals:none  ST&T Change: Nonspecific  ____________________________________________  RADIOLOGY  ED MD interpretation: No acute cardiopulmonary process; no coccyx injuries; ultrasound demonstrating cholelithiasis with questionable dilation of CBD; MRCP demonstrates no CBD dilation  Official radiology report(s): Dg Chest 2 View  Result Date: 09/16/2018 CLINICAL DATA:  Abdominal pain, chest pain. EXAM: CHEST - 2 VIEW COMPARISON:  Chest x-ray dated 10/11/2012. FINDINGS: Heart size and mediastinal contours are within normal limits. Lungs are clear. No pleural effusion or pneumothorax seen. Osseous structures about the chest are unremarkable. IMPRESSION: No active cardiopulmonary disease. No evidence of pneumonia or pulmonary  edema. Electronically Signed   By: Bary Richard M.D.   On: 09/16/2018 21:08   Dg Sacrum/coccyx  Result Date: 09/17/2018 CLINICAL DATA:  Coccygeal pain for 1 week. No known injury. EXAM: SACRUM AND COCCYX - 2+ VIEW COMPARISON:  CT abdomen and pelvis 03/26/2015 FINDINGS: Sclerotic bone island in the right iliac bone unchanged since previous CT. Sacral coccygeal spine appears intact. No focal bone lesion or bone destruction identified. Sacral struts appear intact and symmetrical. No evidence of acute fracture or depression. Bone cortex appears intact. IMPRESSION: No acute bony abnormalities. Electronically Signed   By: Burman Nieves M.D.   On: 09/17/2018 00:38   Mr Abdomen Mrcp Vivien Rossetti Contast  Result Date: 09/17/2018 CLINICAL DATA:  Cholelithiasis with dilated common bile duct on ultrasound. EXAM: MRI ABDOMEN WITHOUT AND WITH CONTRAST (INCLUDING MRCP) TECHNIQUE: Multiplanar multisequence MR imaging of the abdomen was performed both before and after the administration of intravenous contrast. Heavily T2-weighted images of the biliary and pancreatic ducts were obtained, and three-dimensional MRCP images were rendered by post processing. CONTRAST:  7 mL Gadavist COMPARISON:  Ultrasound right upper quadrant 09/17/2018. CT abdomen and pelvis 03/26/2015 FINDINGS: Lower chest: Lung bases are clear. Hepatobiliary: Cholelithiasis with multiple stones in the gallbladder. No bile duct dilatation. No focal liver lesions. Pancreas: No mass, inflammatory changes, or other parenchymal abnormality identified. Spleen:  Within normal limits in size and appearance. Adrenals/Urinary Tract: No masses identified. No evidence of hydronephrosis. Stomach/Bowel: Visualized portions within the abdomen are unremarkable. Vascular/Lymphatic: No pathologically enlarged lymph nodes identified. No abdominal aortic aneurysm demonstrated. Other:  No free fluid demonstrated in the upper abdomen. Musculoskeletal: Marrow signal intensities  appear homogeneous and normal. IMPRESSION: Cholelithiasis without inflammatory changes. No bile duct dilatation. No common duct stones identified. Electronically Signed   By: Burman Nieves M.D.   On: 09/17/2018 05:09   US Abdomen Limited Ruq  Result Date: 09/17/2018 CLINICAL DATA:  Abdominal pain with nausea. EXAM: ULTRASOUND ABDOMEN LIMITED RIGHT UPPER QUADRANT COMPARISON:  None. FINDINGS: Gallbladder: Gallbladder appears to be filled with stones and/or sludge. Largest measurable stone is 2 cm greatest dimension. No gallbladder wall thickening or pericholecystic fluid seen. Common bile duct: Diameter: 7 mm Liver: Liver is diffusely echogenic indicating fatty infiltration. No focal mass or lesion demonstrated within the liver. Portal vein is patent on color Doppler imaging with normal direction of blood flow towards the liver. IMPRESSION: 1. Cholelithiasis without evidence of acute cholecystitis. 2. Common bile duct is mildly prominent, measuring 6-7 mm diameter, raising the possibility of partially obstructing CBD stone or sludge. 3. Fatty infiltration of the liver. Electronically Signed  By: Bary Richard M.D.   On: 09/17/2018 01:52    ____________________________________________   PROCEDURES  Procedure(s) performed: None  Procedures  Critical Care performed: No  ____________________________________________   INITIAL IMPRESSION / ASSESSMENT AND PLAN / ED COURSE  As part of my medical decision making, I reviewed the following data within the electronic MEDICAL RECORD NUMBER History obtained from family, Nursing notes reviewed and incorporated, Labs reviewed, Radiograph reviewed and Notes from prior ED visits   32 year old female who presents with upper abdominal pain associated with nausea. Differential diagnosis includes, but is not limited to, biliary disease (biliary colic, acute cholecystitis, cholangitis, choledocholithiasis, etc), intrathoracic causes for epigastric abdominal pain  including ACS, gastritis, duodenitis, pancreatitis, small bowel or large bowel obstruction, abdominal aortic aneurysm, hernia, and ulcer(s).  Laboratory results unremarkable except for mild UTI.  Will send for ultrasound to evaluate cholelithiasis/cholecystitis.  Initiate IV fluid resuscitation, 4 mg IV Zofran for nausea.  Patient rates pain only 1/10 currently.  Clinical Course as of Sep 17 520  Wynelle Link Sep 17, 2018  0203 Updated patient and spouse on ultrasound results.  I did speak with GI on-call Dr. Tobi Bastos who does recommend MRCP tonight.  If patient does have an extracting stone, she would be able to be admitted here as ERCP is available tomorrow.   [JS]  0518 Updated patient and spouse on MRCP result.  Will discharge home on Percocet and Zofran to use as needed for biliary colic and she will follow-up with surgery next week.  Strict return precautions given.  Both verbalize understanding and agree with plan of care.   [JS]    Clinical Course User Index [JS] Irean Hong, MD     ____________________________________________   FINAL CLINICAL IMPRESSION(S) / ED DIAGNOSES  Final diagnoses:  Lower urinary tract infectious disease  Pain of upper abdomen  Biliary colic  Calculus of gallbladder without cholecystitis without obstruction     ED Discharge Orders    None       Note:  This document was prepared using Dragon voice recognition software and may include unintentional dictation errors.    Irean Hong, MD 09/17/18 [phone]

## 2018-09-17 ENCOUNTER — Emergency Department: Payer: BLUE CROSS/BLUE SHIELD

## 2018-09-17 DIAGNOSIS — M533 Sacrococcygeal disorders, not elsewhere classified: Secondary | ICD-10-CM | POA: Diagnosis not present

## 2018-09-17 DIAGNOSIS — K8020 Calculus of gallbladder without cholecystitis without obstruction: Secondary | ICD-10-CM | POA: Diagnosis not present

## 2018-09-17 MED ORDER — ONDANSETRON 4 MG PO TBDP: 4.0000 mg | ORAL_TABLET | Freq: Three times a day (TID) | ORAL | 0 refills | Status: DC | PRN

## 2018-09-17 MED ORDER — LORAZEPAM 2 MG/ML IJ SOLN
1.0000 mg | Freq: Once | INTRAMUSCULAR | Status: AC
  Administered 2018-09-17: 1 mg via INTRAVENOUS
  Filled 2018-09-17: qty 1

## 2018-09-17 MED ORDER — FOSFOMYCIN TROMETHAMINE 3 G PO PACK
3.0000 g | PACK | Freq: Once | ORAL | Status: AC
  Administered 2018-09-17: 3 g via ORAL
  Filled 2018-09-17: qty 3

## 2018-09-17 MED ORDER — OXYCODONE-ACETAMINOPHEN 5-325 MG PO TABS: 1.0000 | ORAL_TABLET | ORAL | 0 refills | Status: AC | PRN

## 2018-09-17 MED ORDER — GADOBUTROL 1 MMOL/ML IV SOLN
7.0000 mL | Freq: Once | INTRAVENOUS | Status: AC | PRN
  Administered 2018-09-17: 7 mL via INTRAVENOUS

## 2018-09-17 NOTE — ED Notes (Signed)
Pt to xray

## 2018-09-17 NOTE — ED Notes (Signed)
Patient transported to Ultrasound 

## 2018-09-17 NOTE — Discharge Instructions (Addendum)
1. Take medicines as needed for pain & nausea (Percocet/Zofran #30). 2. Clear liquids x 12 hours, then bland diet x 1 week, then slowly advance diet as tolerated. Avoid fatty, greasy, spicy foods and drinks. 3. Return to the ER for worsening symptoms, persistent vomiting, fever, difficulty breathing or other concerns.  

## 2018-09-18 ENCOUNTER — Encounter: Payer: Self-pay | Admitting: *Deleted

## 2018-09-18 ENCOUNTER — Other Ambulatory Visit: Payer: Self-pay

## 2018-09-18 ENCOUNTER — Ambulatory Visit (INDEPENDENT_AMBULATORY_CARE_PROVIDER_SITE_OTHER): Payer: BLUE CROSS/BLUE SHIELD | Admitting: Surgery

## 2018-09-18 ENCOUNTER — Encounter: Payer: Self-pay | Admitting: Surgery

## 2018-09-18 VITALS — BP 160/102 | HR 71 | Resp 13 | Ht 63.0 in | Wt 168.0 lb

## 2018-09-18 DIAGNOSIS — K8020 Calculus of gallbladder without cholecystitis without obstruction: Secondary | ICD-10-CM | POA: Diagnosis not present

## 2018-09-18 MED ORDER — PROCHLORPERAZINE MALEATE 10 MG PO TABS: 10.0000 mg | ORAL_TABLET | Freq: Four times a day (QID) | ORAL | 0 refills | Status: AC | PRN

## 2018-09-18 NOTE — Patient Instructions (Signed)
Cholelithiasis Cholelithiasis is also called "gallstones." It is a kind of gallbladder disease. The gallbladder is an organ that stores a liquid (bile) that helps you digest fat. Gallstones may not cause symptoms (may be silent gallstones) until they cause a blockage, and then they can cause pain (gallbladder attack). Follow these instructions at home:  Take over-the-counter and prescription medicines only as told by your doctor.  Stay at a healthy weight.  Eat healthy foods. This includes: ? Eating fewer fatty foods, like fried foods. ? Eating fewer refined carbs (refined carbohydrates). Refined carbs are breads and grains that are highly processed, like white bread and white rice. Instead, choose whole grains like whole-wheat bread and brown rice. ? Eating more fiber. Almonds, fresh fruit, and beans are healthy sources of fiber.  Keep all follow-up visits as told by your doctor. This is important. Contact a doctor if:  You have sudden pain in the upper right side of your belly (abdomen). Pain might spread to your right shoulder or your chest. This may be a sign of a gallbladder attack.  You feel sick to your stomach (are nauseous).  You throw up (vomit).  You have been diagnosed with gallstones that have no symptoms and you get: ? Belly pain. ? Discomfort, burning, or fullness in the upper part of your belly (indigestion). Get help right away if:  You have sudden pain in the upper right side of your belly, and it lasts for more than 2 hours.  You have belly pain that lasts for more than 5 hours.  You have a fever or chills.  You keep feeling sick to your stomach or you keep throwing up.  Your skin or the whites of your eyes turn yellow (jaundice).  You have dark-colored pee (urine).  You have light-colored poop (stool). Summary  Cholelithiasis is also called "gallstones."  The gallbladder is an organ that stores a liquid (bile) that helps you digest fat.  Silent  gallstones are gallstones that do not cause symptoms.  A gallbladder attack may cause sudden pain in the upper right side of your belly. Pain might spread to your right shoulder or your chest. If this happens, contact your doctor.  If you have sudden pain in the upper right side of your belly that lasts for more than 2 hours, get help right away. This information is not intended to replace advice given to you by your health care provider. Make sure you discuss any questions you have with your health care provider. Document Released: 03/29/2008 Document Revised: 06/27/2016 Document Reviewed: 06/27/2016 Elsevier Interactive Patient Education  2017 [employer] Inc[employer]  

## 2018-09-18 NOTE — Progress Notes (Signed)
Patient's surgery has [facility] 09[facility]28-[facility] with Dr. Everlene FarrierPabon.  The patient is aware she will be contacted by the Pre-Admission Testing Department to complete a phone interview sometime in the near future.  The patient is aware to call the office should they have further questions.

## 2018-09-18 NOTE — Progress Notes (Signed)
Patient ID: Carrie Marsh, female   DOB: 04/16/1986, 32 y.o.   MRN: [id number]  HPI Carrie Marsh is a 32 y.o. female seen in consultation at the request of Dr. Sung with a history of right upper quadrant pain and intermittent nausea and vomiting.  She reports that about 4 days ago started having right upper quadrant pain and abdominal discomfort.  The pain was intermittent sharp in nature.  Now the pain has subsided.  She continues to have nausea.  Apparently the symptoms are triggered after having meals.  No fevers no chills no evidence, No w, of biliary obstruction or jaundice.  weight loss Mother with gallbladder issues required cholecystectomy. She is able to perform more than 4 METS of activity without any shortness of breath or chest pain.  Only previous abdominal operations is a C-section. Had an ultrasound that have personally reviewed and evidence of cholelithiasis.  There is a prominent common bile duct.  I also reviewed her MRCP showing no evidence of common bile duct obstruction or choledocholithiasis. This is normal CMP is normal except a mild elevation of the AST.  HPI  Past Medical History:  Diagnosis Date  . Dehydration 01/04/2017  . GERD (gastroesophageal reflux disease)   . History of gestational hypertension 01/04/2017  . Ketonuria 01/04/2017  . Oliguria 01/04/2017  . Pregnancy induced hypertension    first pregnancy    Past Surgical History:  Procedure Laterality Date  . CESAREAN SECTION N/A 02/16/2014   Procedure: CESAREAN SECTION;  Surgeon: James E Tomblin II, MD;  Location: [facility] ORS;  Service: Obstetrics;  Laterality: N/A;  . CESAREAN SECTION N/A 03/03/2017   Procedure: Repeat CESAREAN SECTION;  Surgeon: Mody, Vaishali, MD;  Location: [facility]IRTHING [facility];  Service: Obstetrics;  Laterality: N/A;  EDD: 03/10/17  . WISDOM TOOTH EXTRACTION      Family History  Problem Relation Age of Onset  . Diabetes Mother   . Rheum arthritis Father   . Diabetes Father   . Cancer  Paternal Grandmother     Social History Social History   Tobacco Use  . Smoking status: Passive Smoke Exposure - Never Smoker  . Smokeless tobacco: Never Used  Substance Use Topics  . Alcohol use: No  . Drug use: No    No Known Allergies  Current Outpatient Medications  Medication Sig Dispense Refill  . ibuprofen (ADVIL,MOTRIN) 600 MG tablet Take 1 tablet (600 mg total) by mouth every 6 (six) hours. 30 tablet 0  . ondansetron (ZOFRAN ODT) 4 MG disintegrating tablet Take 1 tablet (4 mg total) by mouth every 8 (eight) hours as needed for nausea or vomiting. 30 tablet 0  . oxyCODONE (OXY IR/ROXICODONE) 5 MG immediate release tablet Take 1 tablet (5 mg total) by mouth every 4 (four) hours as needed (pain scale 4-7). 30 tablet 0  . oxyCODONE-acetaminophen (PERCOCET/ROXICET) 5-325 MG tablet Take 1 tablet by mouth every 4 (four) hours as needed for severe pain. 30 tablet 0  . Prenat-FeFmCb-DSS-FA-DHA w/o A (CITRANATAL HARMONY) 27-1-260 MG CAPS Take 1 tablet by mouth at bedtime.   10  . prochlorperazine (COMPAZINE) 10 MG tablet Take 1 tablet (10 mg total) by mouth every 6 (six) hours as needed for nausea or vomiting. 20 tablet 0   No current facility-administered medications for this visit.    Facility-Administered Medications Ordered in Other Visits  Medication Dose Route Frequency Provider Last Rate Last Dose  . ondansetron (ZOFRAN) tablet 4 mg  4 mg Oral Once Hedges, Jeffrey, PA-C           Review of Systems Full ROS  was asked and was negative except for the information on the HPI  Physical Exam Blood pressure (!) 160/102, pulse 71, resp. rate 13, height 5' 3" (1.6 m), weight 168 lb (76.2 kg), last menstrual period 09/11/2018, unknown if currently breastfeeding. CONSTITUTIONAL: NAD EYES: Pupils are equal, round, and reactive to light, Sclera are non-icteric. EARS, NOSE, MOUTH AND THROAT: The oropharynx is clear. The oral mucosa is pink and moist. Hearing is intact to voice. LYMPH  NODES:  Lymph nodes in the neck are normal. RESPIRATORY:  Lungs are clear. There is normal respiratory effort, with equal breath sounds bilaterally, and without pathologic use of accessory muscles. CARDIOVASCULAR: Heart is regular without murmurs, gallops, or rubs. GI: The abdomen is  soft, nontender, and nondistended. There are no palpable masses. There is no hepatosplenomegaly. There are normal bowel sounds in all quadrants. GU: Rectal deferred.   MUSCULOSKELETAL: Normal muscle strength and tone. No cyanosis or edema.   SKIN: Turgor is good and there are no pathologic skin lesions or ulcers. NEUROLOGIC: Motor and sensation is grossly normal. Cranial nerves are grossly intact. PSYCH:  Oriented to person, place and time. Affect is normal.  Data Reviewed  I have personally reviewed the patient's imaging, laboratory findings and medical records.    Assessment/Plan 32-year-old female with symptomatic cholelithiasis.  No evidence of choledocholithiasis at this time.  Discussed with the patient in detail about her disease process.  She still symptomatic from a nausea perspective and we will add Compazine as needed.  I do recommend elective cholecystectomy in the near future and I do think she is a great candidate for robotic cholecystectomy The risks, benefits, complications, treatment options, and expected outcomes were discussed with the patient. The possibilities of bleeding, recurrent infection, finding a normal gallbladder, perforation of viscus organs, damage to surrounding structures, bile leak, abscess formation, needing a drain placed, the need for additional procedures, reaction to medication, pulmonary aspiration,  failure to diagnose a condition, the possible need to convert to an open procedure, and creating a complication requiring transfusion or operation were discussed with the patient. The patient and/or family concurred with the proposed plan, giving informed consent.  A copy of This  report was sent to the referring provider   Socorro Kanitz, MD FACS General Surgeon 09/18/2018, 1:41 PM   

## 2018-09-18 NOTE — H&P (View-Only) (Signed)
Patient ID: Carrie Marsh, female   DOB: 07/21/1986, 32 y.o.   MRN: [id number]  HPI Carrie Marsh is a 32 y.o. female seen in consultation at the request of Dr. Dolores FrameSung with a history of right upper quadrant pain and intermittent nausea and vomiting.  She reports that about 4 days ago started having right upper quadrant pain and abdominal discomfort.  The pain was intermittent sharp in nature.  Now the pain has subsided.  She continues to have nausea.  Apparently the symptoms are triggered after having meals.  No fevers no chills no evidence, No w, of biliary obstruction or jaundice.  weight loss Mother with gallbladder issues required cholecystectomy. She is able to perform more than 4 METS of activity without any shortness of breath or chest pain.  Only previous abdominal operations is a C-section. Had an ultrasound that have personally reviewed and evidence of cholelithiasis.  There is a prominent common bile duct.  I also reviewed her MRCP showing no evidence of common bile duct obstruction or choledocholithiasis. This is normal CMP is normal except a mild elevation of the AST.  HPI  Past Medical History:  Diagnosis Date  . Dehydration 01/04/2017  . GERD (gastroesophageal reflux disease)   . History of gestational hypertension 01/04/2017  . Ketonuria 01/04/2017  . Oliguria 01/04/2017  . Pregnancy induced hypertension    first pregnancy    Past Surgical History:  Procedure Laterality Date  . CESAREAN SECTION N/A 02/16/2014   Procedure: CESAREAN SECTION;  Surgeon: Leslie AndreaJames E Tomblin II, MD;  Location: [facility];  Service: Obstetrics;  Laterality: N/A;  . CESAREAN SECTION N/A 03/03/2017   Procedure: Repeat CESAREAN SECTION;  Surgeon: Shea EvansMody, Vaishali, MD;  [facility];  Service: Obstetrics;  Laterality: N/A;  EDD: 03/10/17  . WISDOM TOOTH EXTRACTION      Family History  Problem Relation Age of Onset  . Diabetes Mother   . Rheum arthritis Father   . Diabetes Father   . Cancer  Paternal Grandmother     Social History Social History   Tobacco Use  . Smoking status: Passive Smoke Exposure - Never Smoker  . Smokeless tobacco: Never Used  Substance Use Topics  . Alcohol use: No  . Drug use: No    No Known Allergies  Current Outpatient Medications  Medication Sig Dispense Refill  . ibuprofen (ADVIL,MOTRIN) 600 MG tablet Take 1 tablet (600 mg total) by mouth every 6 (six) hours. 30 tablet 0  . ondansetron (ZOFRAN ODT) 4 MG disintegrating tablet Take 1 tablet (4 mg total) by mouth every 8 (eight) hours as needed for nausea or vomiting. 30 tablet 0  . oxyCODONE (OXY IR/ROXICODONE) 5 MG immediate release tablet Take 1 tablet (5 mg total) by mouth every 4 (four) hours as needed (pain scale 4-7). 30 tablet 0  . oxyCODONE-acetaminophen (PERCOCET/ROXICET) 5-325 MG tablet Take 1 tablet by mouth every 4 (four) hours as needed for severe pain. 30 tablet 0  . Prenat-FeFmCb-DSS-FA-DHA w/o A (CITRANATAL HARMONY) 27-1-260 MG CAPS Take 1 tablet by mouth at bedtime.   10  . prochlorperazine (COMPAZINE) 10 MG tablet Take 1 tablet (10 mg total) by mouth every 6 (six) hours as needed for nausea or vomiting. 20 tablet 0   No current facility-administered medications for this visit.    Facility-Administered Medications Ordered in Other Visits  Medication Dose Route Frequency Provider Last Rate Last Dose  . ondansetron (ZOFRAN) tablet 4 mg  4 mg Oral Once Eyvonne MechanicHedges, Jeffrey, PA-C  Review of Systems Full ROS  was asked and was negative except for the information on the HPI  Physical Exam Blood pressure (!) 160/102, pulse 71, resp. rate 13, height 5\' 3"  (1.6 m), weight 168 lb (76.2 kg), last menstrual period 09/11/2018, unknown if currently breastfeeding. CONSTITUTIONAL: NAD EYES: Pupils are equal, round, and reactive to light, Sclera are non-icteric. EARS, NOSE, MOUTH AND THROAT: The oropharynx is clear. The oral mucosa is pink and moist. Hearing is intact to voice. LYMPH  NODES:  Lymph nodes in the neck are normal. RESPIRATORY:  Lungs are clear. There is normal respiratory effort, with equal breath sounds bilaterally, and without pathologic use of accessory muscles. CARDIOVASCULAR: Heart is regular without murmurs, gallops, or rubs. GI: The abdomen is  soft, nontender, and nondistended. There are no palpable masses. There is no hepatosplenomegaly. There are normal bowel sounds in all quadrants. GU: Rectal deferred.   MUSCULOSKELETAL: Normal muscle strength and tone. No cyanosis or edema.   SKIN: Turgor is good and there are no pathologic skin lesions or ulcers. NEUROLOGIC: Motor and sensation is grossly normal. Cranial nerves are grossly intact. PSYCH:  Oriented to person, place and time. Affect is normal.  Data Reviewed  I have personally reviewed the patient's imaging, laboratory findings and medical records.    Assessment/Plan 32 year old female with symptomatic cholelithiasis.  No evidence of choledocholithiasis at this time.  Discussed with the patient in detail about her disease process.  She still symptomatic from a nausea perspective and we will add Compazine as needed.  I do recommend elective cholecystectomy in the near future and I do think she is a great candidate for robotic cholecystectomy The risks, benefits, complications, treatment options, and expected outcomes were discussed with the patient. The possibilities of bleeding, recurrent infection, finding a normal gallbladder, perforation of viscus organs, damage to surrounding structures, bile leak, abscess formation, needing a drain placed, the need for additional procedures, reaction to medication, pulmonary aspiration,  failure to diagnose a condition, the possible need to convert to an open procedure, and creating a complication requiring transfusion or operation were discussed with the patient. The patient and/or family concurred with the proposed plan, giving informed consent.  A copy of This  report was sent to the referring provider   Sterling Big, MD FACS General Surgeon 09/18/2018, 1:41 PM

## 2018-09-27 ENCOUNTER — Encounter
Admission: RE | Admit: 2018-09-27 | Discharge: 2018-09-27 | Disposition: A | Discharge status: Home / Self Care | Payer: BLUE CROSS/BLUE SHIELD | Source: Ambulatory Visit | Attending: Surgery | Admitting: Surgery

## 2018-09-27 ENCOUNTER — Other Ambulatory Visit: Payer: Self-pay

## 2018-09-27 HISTORY — DX: Anxiety disorder, unspecified: F41.9

## 2018-09-27 HISTORY — DX: Headache: R51

## 2018-09-27 HISTORY — DX: Headache, unspecified: R51.9

## 2018-09-27 HISTORY — DX: Major depressive disorder, single episode, unspecified: F32.9

## 2018-09-27 MED ORDER — CEFAZOLIN SODIUM-DEXTROSE 2-4 GM/100ML-% IV SOLN
2.0000 g | INTRAVENOUS | Status: AC
  Administered 2018-09-28: 2 g via INTRAVENOUS

## 2018-09-27 MED ORDER — INDOCYANINE GREEN 25 MG IV SOLR
7.5000 mg | Freq: Once | INTRAVENOUS | Status: AC
  Administered 2018-09-28: 7.5 mg via INTRAVENOUS
  Filled 2018-09-27: qty 25

## 2018-09-27 NOTE — Patient Instructions (Signed)
Your procedure is scheduled on: 09-28-18 Report to Same Day Surgery 2nd floor medical mall [facility](Medical Mall Entrance-take elevator on left to 2nd floor.  Check in with surgery information desk.) @ 8AM PER PT   Remember: Instructions that are not followed completely may result in serious medical risk, up to and including death, or upon the discretion of your surgeon and anesthesiologist your surgery may need to be rescheduled.    _x___ 1. Do not eat food after midnight the night before your procedure. You may drink clear liquids up to 2 hours before you are scheduled to arrive at the hospital for your procedure.  Do not drink clear liquids within 2 hours of your scheduled arrival to the hospital.  Clear liquids include  --Water or Apple juice without pulp  --Clear carbohydrate beverage such as ClearFast or Gatorade  --Black Coffee or Clear Tea (No milk, no creamers, do not add anything to  the coffee or Tea Type 1 and type 2 diabetics should only drink water.   ____Ensure clear carbohydrate drink on the way to the hospital for bariatric patients  ____Ensure clear carbohydrate drink 3 hours before surgery for Dr Rutherford NailByrnett's patients if physician instructed.   No gum chewing or hard candies.     __x__ 2. No Alcohol for 24 hours before or after surgery.   __x__3. No Smoking or e-cigarettes for 24 prior to surgery.  Do not use any chewable tobacco products for at least 6 hour prior to surgery   ____  4. Bring all medications with you on the day of surgery if instructed.    __x__ 5. Notify your doctor if there is any change in your medical condition     (cold, fever, infections).    x___6. On the morning of surgery brush your teeth with toothpaste and water.  You may rinse your mouth with mouth wash if you wish.  Do not swallow any toothpaste or mouthwash.   Do not wear jewelry, make-up, hairpins, clips or nail polish.  Do not wear lotions, powders, or perfumes. You may wear deodorant.  Do not  shave 48 hours prior to surgery. Men may shave face and neck.  Do not bring valuables to the hospital.    [facility] is not responsible for any belongings or valuables.               Contacts, dentures or bridgework may not be worn into surgery.  Leave your suitcase in the car. After surgery it may be brought to your room.  For patients admitted to the hospital, discharge time is determined by your treatment team.  _  Patients discharged the day of surgery will not be allowed to drive home.  You will need someone to drive you home and stay with you the night of your procedure.    Please read over the following fact sheets that you were given:   [facility] Pre[facility]  _x___ TAKE THE FOLLOWING MEDICATION THE MORNING OF SURGERY WITH A SMALL SIP OF WATER. These include:  1. YOU MAY TAKE YOUR PERCOCET/COMPAZINE DAY OF SURGERY IF NEEDED  2.  3.  4.  5.  6.  ____Fleets enema or Magnesium Citrate as directed.   ____ Use CHG Soap or sage wipes as directed on instruction sheet   ____ Use inhalers on the day of surgery and bring to hospital day of surgery  ____ Stop Metformin and Janumet 2 days prior to surgery.    ____ Take 1/2  of usual insulin dose the night before surgery and none on the morning surgery.   ____ Follow recommendations from Cardiologist, Pulmonologist or PCP regarding stopping Aspirin, Coumadin, Plavix ,Eliquis, Effient, or Pradaxa, and Pletal.  X____Stop Anti-inflammatories such as Advil, Aleve, Ibuprofen, Motrin, Naproxen, Naprosyn, Goodies powders or aspirin products NOW-OK to take Tylenol    ____ Stop supplements until after surgery.     ____ Bring C-Pap to the hospital.

## 2018-09-27 NOTE — Pre-Procedure Instructions (Signed)
EKG  ED ECG REPORT I, SUNG,JADE J, the attending physician, personally viewed and interpreted this ECG.   Date: 09/17/2018  EKG Time: 2042          Rate: 70  Rhythm: normal EKG, normal sinus rhythm  Axis: Normal  Intervals:none  ST&T Change: Nonspecific  ____________________________________________  RADIOLOGY  ED MD interpretation: No acute cardiopulmonary process; no coccyx injuries; ultrasound demonstrating cholelithiasis with questionable dilation of CBD; MRCP demonstrates no CBD dilation  Official radiology report(s): Dg Chest 2 View  Result Date: 09/16/2018 CLINICAL DATA:  Abdominal pain, chest pain. EXAM: CHEST - 2 VIEW COMPARISON:  Chest x-ray dated 10/11/2012. FINDINGS: Heart size and mediastinal contours are within normal limits. Lungs are clear. No pleural effusion or pneumothorax seen. Osseous structures about the chest are unremarkable. IMPRESSION: No active cardiopulmonary disease. No evidence of pneumonia or pulmonary edema. Electronically Signed   By: Bary RichardStan  Maynard M.D.   On: 09/16/2018 21:08   Dg Sacrum/coccyx  Result Date: 09/17/2018 CLINICAL DATA:  Coccygeal pain for 1 week. No known injury. EXAM: SACRUM AND COCCYX - 2+ VIEW COMPARISON:  CT abdomen and pelvis 03/26/2015 FINDINGS: Sclerotic bone island in the right iliac bone unchanged since previous CT. Sacral coccygeal spine appears intact. No focal bone lesion or bone destruction identified. Sacral struts appear intact and symmetrical. No evidence of acute fracture or depression. Bone cortex appears intact. IMPRESSION: No acute bony abnormalities. Electronically Signed   By: Burman NievesWilliam  Stevens M.D.   On: 09/17/2018 00:38   Mr Abdomen Mrcp Vivien RossettiW Wo Contast  Result Date: 09/17/2018 CLINICAL DATA:  Cholelithiasis with dilated common bile duct on ultrasound. EXAM: MRI ABDOMEN WITHOUT AND WITH CONTRAST (INCLUDING MRCP) TECHNIQUE: Multiplanar multisequence MR imaging of the abdomen was performed both before and  after the administration of intravenous contrast. Heavily T2-weighted images of the biliary and pancreatic ducts were obtained, and three-dimensional MRCP images were rendered by post processing. CONTRAST:  7 mL Gadavist COMPARISON:  Ultrasound right upper quadrant 09/17/2018. CT abdomen and pelvis 03/26/2015 FINDINGS: Lower chest: Lung bases are clear. Hepatobiliary: Cholelithiasis with multiple stones in the gallbladder. No bile duct dilatation. No focal liver lesions. Pancreas: No mass, inflammatory changes, or other parenchymal abnormality identified. Spleen:  Within normal limits in size and appearance. Adrenals/Urinary Tract: No masses identified. No evidence of hydronephrosis. Stomach/Bowel: Visualized portions within the abdomen are unremarkable. Vascular/Lymphatic: No pathologically enlarged lymph nodes identified. No abdominal aortic aneurysm demonstrated. Other:  No free fluid demonstrated in the upper abdomen. Musculoskeletal: Marrow signal intensities appear homogeneous and normal. IMPRESSION: Cholelithiasis without inflammatory changes. No bile duct dilatation. No common duct stones identified. Electronically Signed   By: Burman NievesWilliam  Stevens M.D.   On: 09/17/2018 05:09   [place] Abdomen Limited Ruq  Result Date: 09/17/2018 CLINICAL DATA:  Abdominal pain with nausea. EXAM: ULTRASOUND ABDOMEN LIMITED RIGHT UPPER QUADRANT COMPARISON:  None. FINDINGS: Gallbladder: Gallbladder appears to be filled with stones and/or sludge. Largest measurable stone is 2 cm greatest dimension. No gallbladder wall thickening or pericholecystic fluid seen. Common bile duct: Diameter: 7 mm Liver: Liver is diffusely echogenic indicating fatty infiltration. No focal mass or lesion demonstrated within the liver. Portal vein is patent on color Doppler imaging with normal direction of blood flow towards the liver. IMPRESSION: 1. Cholelithiasis without evidence of acute cholecystitis. 2. Common bile duct is mildly prominent, measuring  6-7 mm diameter, raising the possibility of partially obstructing CBD stone or sludge. 3. Fatty infiltration of the liver. Electronically Signed   By: Weyman CroonStan  Linde Gillis M.D.   On: 09/17/2018 01:52    ____________________________________________   PROCEDURES  Procedure(s) performed: None  Procedures  Critical Care performed: No  ____________________________________________   INITIAL IMPRESSION / ASSESSMENT AND PLAN / ED COURSE  As part of my medical decision making, I reviewed the following data within the electronic MEDICAL RECORD NUMBER History obtained from family, Nursing notes reviewed and incorporated, Labs reviewed, Radiograph reviewed and Notes from prior ED visits   32 year old female who presents with upper abdominal pain associated with nausea. Differential diagnosis includes, but is not limited to, biliary disease (biliary colic, acute cholecystitis, cholangitis, choledocholithiasis, etc), intrathoracic causes for epigastric abdominal pain including ACS, gastritis, duodenitis, pancreatitis, small bowel or large bowel obstruction, abdominal aortic aneurysm, hernia, and ulcer(s).  Laboratory results unremarkable except for mild UTI.  Will send for ultrasound to evaluate cholelithiasis/cholecystitis.  Initiate IV fluid resuscitation, 4 mg IV Zofran for nausea.  Patient rates pain only 1/10 currently.     Clinical Course as of Sep 17 520  Wynelle Link Sep 17, 2018  0203 Updated patient and spouse on ultrasound results.  I did speak with GI on-call Dr. Tobi Bastos who does recommend MRCP tonight.  If patient does have an extracting stone, she would be able to be admitted here as ERCP is available tomorrow.   [JS]  0518 Updated patient and spouse on MRCP result.  Will discharge home on Percocet and Zofran to use as needed for biliary colic and she will follow-up with surgery next week.  Strict return precautions given.  Both verbalize understanding and agree with plan of care.   [JS]      Clinical Course User Index [JS] Irean Hong, MD     ____________________________________________   FINAL CLINICAL IMPRESSION(S) / ED DIAGNOSES  Final diagnoses:  Lower urinary tract infectious disease  Pain of upper abdomen  Biliary colic  Calculus of gallbladder without cholecystitis without obstruction        ED Discharge Orders    None       Note:  This document was prepared using Dragon voice recognition software and may include unintentional dictation errors.    Irean Hong, MD 09/17/18 [phone]         Electronically signed by Irean Hong, MD at 09/17/2018 7:18 AM     ED on 09/16/2018       Detailed Report

## 2018-09-28 ENCOUNTER — Ambulatory Visit
Admission: RE | Admit: 2018-09-28 | Discharge: 2018-09-28 | Disposition: A | Discharge status: Home / Self Care | Payer: BLUE CROSS/BLUE SHIELD | Source: Ambulatory Visit | Attending: Surgery | Admitting: Surgery

## 2018-09-28 ENCOUNTER — Ambulatory Visit: Payer: BLUE CROSS/BLUE SHIELD | Admitting: Anesthesiology

## 2018-09-28 ENCOUNTER — Encounter: Payer: Self-pay | Admitting: *Deleted

## 2018-09-28 ENCOUNTER — Encounter
Admission: RE | Disposition: A | Discharge status: Home / Self Care | Payer: Self-pay | Source: Ambulatory Visit | Attending: Surgery

## 2018-09-28 DIAGNOSIS — Z79899 Other long term (current) drug therapy: Secondary | ICD-10-CM | POA: Insufficient documentation

## 2018-09-28 DIAGNOSIS — Z7722 Contact with and (suspected) exposure to environmental tobacco smoke (acute) (chronic): Secondary | ICD-10-CM | POA: Insufficient documentation

## 2018-09-28 DIAGNOSIS — K811 Chronic cholecystitis: Secondary | ICD-10-CM | POA: Diagnosis not present

## 2018-09-28 DIAGNOSIS — K8020 Calculus of gallbladder without cholecystitis without obstruction: Secondary | ICD-10-CM

## 2018-09-28 DIAGNOSIS — K8010 Calculus of gallbladder with chronic cholecystitis without obstruction: Secondary | ICD-10-CM | POA: Diagnosis not present

## 2018-09-28 DIAGNOSIS — K429 Umbilical hernia without obstruction or gangrene: Secondary | ICD-10-CM | POA: Diagnosis not present

## 2018-09-28 HISTORY — PX: ROBOTIC ASSISTED LAPAROSCOPIC CHOLECYSTECTOMY-MULTI SITE: SHX6603

## 2018-09-28 HISTORY — PX: UMBILICAL HERNIA REPAIR: SHX196

## 2018-09-28 LAB — POCT PREGNANCY, URINE: PREG TEST UR: NEGATIVE

## 2018-09-28 SURGERY — ROBOTIC ASSISTED LAPAROSCOPIC CHOLECYSTECTOMY-MULTI SITE
Anesthesia: General

## 2018-09-28 MED ORDER — SODIUM CHLORIDE 0.9 % IV SOLN
INTRAVENOUS | Status: DC
  Administered 2018-09-28: 09:00:00 via INTRAVENOUS

## 2018-09-28 MED ORDER — BUPIVACAINE-EPINEPHRINE (PF) 0.25% -1:200000 IJ SOLN
INTRAMUSCULAR | Status: AC
  Filled 2018-09-28: qty 30

## 2018-09-28 MED ORDER — LACTATED RINGERS IV SOLN
INTRAVENOUS | Status: DC
  Administered 2018-09-28: 09:00:00 via INTRAVENOUS

## 2018-09-28 MED ORDER — BUPIVACAINE HCL 0.25 % IJ SOLN
INTRAMUSCULAR | Status: DC | PRN
  Administered 2018-09-28: 30 mL

## 2018-09-28 MED ORDER — ACETAMINOPHEN 500 MG PO TABS
ORAL_TABLET | ORAL | Status: AC
  Filled 2018-09-28: qty 2

## 2018-09-28 MED ORDER — ACETAMINOPHEN 500 MG PO TABS
1000.0000 mg | ORAL_TABLET | ORAL | Status: AC
  Administered 2018-09-28: 1000 mg via ORAL

## 2018-09-28 MED ORDER — PROPOFOL 10 MG/ML IV BOLUS
INTRAVENOUS | Status: AC
  Filled 2018-09-28: qty 20

## 2018-09-28 MED ORDER — CELECOXIB 200 MG PO CAPS
ORAL_CAPSULE | ORAL | Status: AC
  Filled 2018-09-28: qty 1

## 2018-09-28 MED ORDER — MIDAZOLAM HCL 2 MG/2ML IJ SOLN
INTRAMUSCULAR | Status: AC
  Filled 2018-09-28: qty 2

## 2018-09-28 MED ORDER — SUGAMMADEX SODIUM 200 MG/2ML IV SOLN
INTRAVENOUS | Status: AC
  Filled 2018-09-28: qty 2

## 2018-09-28 MED ORDER — CHLORHEXIDINE GLUCONATE CLOTH 2 % EX PADS
6.0000 | MEDICATED_PAD | Freq: Once | CUTANEOUS | Status: AC
  Administered 2018-09-28: 6 via TOPICAL

## 2018-09-28 MED ORDER — ROCURONIUM BROMIDE 100 MG/10ML IV SOLN
INTRAVENOUS | Status: DC | PRN
  Administered 2018-09-28: 50 mg via INTRAVENOUS

## 2018-09-28 MED ORDER — FENTANYL CITRATE (PF) 100 MCG/2ML IJ SOLN
INTRAMUSCULAR | Status: DC | PRN
  Administered 2018-09-28 (×3): 50 ug via INTRAVENOUS
  Administered 2018-09-28: 100 ug via INTRAVENOUS

## 2018-09-28 MED ORDER — PROPOFOL 10 MG/ML IV BOLUS
INTRAVENOUS | Status: DC | PRN
  Administered 2018-09-28: 160 mg via INTRAVENOUS

## 2018-09-28 MED ORDER — ONDANSETRON HCL 4 MG/2ML IJ SOLN
4.0000 mg | Freq: Once | INTRAMUSCULAR | Status: AC | PRN
  Administered 2018-09-28: 4 mg via INTRAVENOUS

## 2018-09-28 MED ORDER — FENTANYL CITRATE (PF) 100 MCG/2ML IJ SOLN
25.0000 ug | INTRAMUSCULAR | Status: DC | PRN
  Administered 2018-09-28: 25 ug via INTRAVENOUS

## 2018-09-28 MED ORDER — HYDROCODONE-ACETAMINOPHEN 5-325 MG PO TABS: 1.0000 | ORAL_TABLET | Freq: Four times a day (QID) | ORAL | 0 refills | Status: AC | PRN

## 2018-09-28 MED ORDER — FAMOTIDINE 20 MG PO TABS
20.0000 mg | ORAL_TABLET | Freq: Once | ORAL | Status: AC
  Administered 2018-09-28: 20 mg via ORAL

## 2018-09-28 MED ORDER — CHLORHEXIDINE GLUCONATE CLOTH 2 % EX PADS: 6.0000 | MEDICATED_PAD | Freq: Once | CUTANEOUS | Status: DC

## 2018-09-28 MED ORDER — ONDANSETRON HCL 4 MG/2ML IJ SOLN
INTRAMUSCULAR | Status: AC
  Administered 2018-09-28: 4 mg via INTRAVENOUS
  Filled 2018-09-28: qty 2

## 2018-09-28 MED ORDER — GABAPENTIN 300 MG PO CAPS
300.0000 mg | ORAL_CAPSULE | ORAL | Status: AC
  Administered 2018-09-28: 300 mg via ORAL

## 2018-09-28 MED ORDER — DEXAMETHASONE SODIUM PHOSPHATE 10 MG/ML IJ SOLN
INTRAMUSCULAR | Status: DC | PRN
  Administered 2018-09-28: 10 mg via INTRAVENOUS

## 2018-09-28 MED ORDER — FAMOTIDINE 20 MG PO TABS
ORAL_TABLET | ORAL | Status: AC
  Filled 2018-09-28: qty 1

## 2018-09-28 MED ORDER — SUGAMMADEX SODIUM 200 MG/2ML IV SOLN
INTRAVENOUS | Status: DC | PRN
  Administered 2018-09-28: 200 mg via INTRAVENOUS

## 2018-09-28 MED ORDER — GABAPENTIN 300 MG PO CAPS
ORAL_CAPSULE | ORAL | Status: AC
  Filled 2018-09-28: qty 1

## 2018-09-28 MED ORDER — ONDANSETRON HCL 4 MG/2ML IJ SOLN
INTRAMUSCULAR | Status: DC | PRN
  Administered 2018-09-28: 4 mg via INTRAVENOUS

## 2018-09-28 MED ORDER — FENTANYL CITRATE (PF) 250 MCG/5ML IJ SOLN
INTRAMUSCULAR | Status: AC
  Filled 2018-09-28: qty 5

## 2018-09-28 MED ORDER — MIDAZOLAM HCL 2 MG/2ML IJ SOLN
INTRAMUSCULAR | Status: DC | PRN
  Administered 2018-09-28: 2 mg via INTRAVENOUS

## 2018-09-28 MED ORDER — DEXAMETHASONE SODIUM PHOSPHATE 10 MG/ML IJ SOLN
INTRAMUSCULAR | Status: AC
  Filled 2018-09-28: qty 1

## 2018-09-28 MED ORDER — ONDANSETRON HCL 4 MG/2ML IJ SOLN
INTRAMUSCULAR | Status: AC
  Filled 2018-09-28: qty 2

## 2018-09-28 MED ORDER — CEFAZOLIN SODIUM-DEXTROSE 2-4 GM/100ML-% IV SOLN
INTRAVENOUS | Status: AC
  Filled 2018-09-28: qty 100

## 2018-09-28 MED ORDER — LIDOCAINE HCL (CARDIAC) PF 100 MG/5ML IV SOSY
PREFILLED_SYRINGE | INTRAVENOUS | Status: DC | PRN
  Administered 2018-09-28: 100 mg via INTRAVENOUS

## 2018-09-28 MED ORDER — CELECOXIB 200 MG PO CAPS
200.0000 mg | ORAL_CAPSULE | ORAL | Status: AC
  Administered 2018-09-28: 200 mg via ORAL

## 2018-09-28 MED ORDER — FENTANYL CITRATE (PF) 100 MCG/2ML IJ SOLN
INTRAMUSCULAR | Status: AC
  Administered 2018-09-28: 25 ug via INTRAVENOUS
  Filled 2018-09-28: qty 2

## 2018-09-28 SURGICAL SUPPLY — 43 items
"PENCIL ELECTRO HAND CTR " (MISCELLANEOUS) ×2 IMPLANT
CANISTER SUCT 1200ML W/VALVE (MISCELLANEOUS) ×3 IMPLANT
CHLORAPREP W/TINT 26ML (MISCELLANEOUS) ×3 IMPLANT
CLIP VESOLOCK MED LG 6/CT (CLIP) ×6 IMPLANT
CORD BIP STRL DISP 12FT (MISCELLANEOUS) ×3 IMPLANT
COVER TIP SHEARS 8 DVNC (MISCELLANEOUS) IMPLANT
COVER TIP SHEARS 8MM DA VINCI (MISCELLANEOUS)
COVER WAND RF STERILE (DRAPES) ×3 IMPLANT
DECANTER SPIKE VIAL GLASS SM (MISCELLANEOUS) ×3 IMPLANT
DEFOGGER SCOPE WARMER CLEARIFY (MISCELLANEOUS) ×3 IMPLANT
DERMABOND ADVANCED (GAUZE/BANDAGES/DRESSINGS) ×1
DERMABOND ADVANCED .7 DNX12 (GAUZE/BANDAGES/DRESSINGS) ×2 IMPLANT
DRAPE SHEET LG 3/4 BI-LAMINATE (DRAPES) ×3 IMPLANT
ELECT REM PT RETURN 9FT ADLT (ELECTROSURGICAL) ×3
ELECTRODE REM PT RTRN 9FT ADLT (ELECTROSURGICAL) ×2 IMPLANT
GLOVE BIO SURGEON STRL SZ7 (GLOVE) ×6 IMPLANT
GOWN STRL REUS W/ TWL LRG LVL3 (GOWN DISPOSABLE) ×8 IMPLANT
GOWN STRL REUS W/TWL LRG LVL3 (GOWN DISPOSABLE) ×3
IRRIGATION STRYKERFLOW (MISCELLANEOUS) IMPLANT
IRRIGATOR STRYKERFLOW (MISCELLANEOUS)
IV NS 1000ML (IV SOLUTION)
IV NS 1000ML BAXH (IV SOLUTION) ×2 IMPLANT
KIT ACCESSORY DA VINCI DISP (KITS) ×1
KIT ACCESSORY DVNC DISP (KITS) ×2 IMPLANT
KIT PINK PAD W/HEAD ARE REST (MISCELLANEOUS) ×3
KIT PINK PAD W/HEAD ARM REST (MISCELLANEOUS) ×2 IMPLANT
LABEL OR SOLS (LABEL) ×3 IMPLANT
NEEDLE HYPO 22GX1.5 SAFETY (NEEDLE) ×3 IMPLANT
NS IRRIG 500ML POUR BTL (IV SOLUTION) ×3 IMPLANT
PACK LAP CHOLECYSTECTOMY (MISCELLANEOUS) ×3 IMPLANT
PENCIL ELECTRO HAND CTR (MISCELLANEOUS) ×3 IMPLANT
POUCH SPECIMEN RETRIEVAL 10MM (ENDOMECHANICALS) ×3 IMPLANT
PROGRASP ENDOWRIST DA VINCI (INSTRUMENTS)
PROGRASP ENDOWRIST DVNC (INSTRUMENTS) IMPLANT
SOLUTION ELECTROLUBE (MISCELLANEOUS) ×3 IMPLANT
SPONGE LAP 18X18 RF (DISPOSABLE) ×3 IMPLANT
STRAP SAFETY 5IN WIDE (MISCELLANEOUS) ×3 IMPLANT
SUT ETHIBOND NAB MO 7 #0 18IN (SUTURE) ×1 IMPLANT
SUT MNCRL AB 4-0 PS2 18 (SUTURE) ×3 IMPLANT
SUT VICRYL 0 AB UR-6 (SUTURE) ×6 IMPLANT
TROCAR 130MM GELPORT  DAV (MISCELLANEOUS) ×3 IMPLANT
TROCAR XCEL NON-BLD 5MMX100MML (ENDOMECHANICALS) ×3 IMPLANT
TUBING INSUF HEATED (TUBING) ×3 IMPLANT

## 2018-09-28 NOTE — Anesthesia Preprocedure Evaluation (Signed)
Anesthesia Evaluation  Patient identified by MRN, date of birth, ID band Patient awake    Reviewed: Allergy & Precautions, NPO status , Patient's Chart, lab work & pertinent test results  Airway Mallampati: III       Dental   Pulmonary neg pulmonary ROS,    Pulmonary exam normal        Cardiovascular hypertension, Normal cardiovascular exam     Neuro/Psych  Headaches, PSYCHIATRIC DISORDERS Anxiety Depression    GI/Hepatic Neg liver ROS, GERD  ,  Endo/Other  negative endocrine ROS  Renal/GU negative Renal ROS  negative genitourinary   Musculoskeletal negative musculoskeletal ROS (+)   Abdominal Normal abdominal exam  (+)   Peds negative pediatric ROS (+)  Hematology negative hematology ROS (+)   Anesthesia Other Findings Past Medical History: No date: Anxiety     Comment:  NO MEDS 01/04/2017: Dehydration 2018: Depression     Comment:  PPD No date: GERD (gastroesophageal reflux disease)     Comment:  NO MEDS No date: Headache     Comment:  MIGRAINES 01/04/2017: History of gestational hypertension 01/04/2017: Ketonuria 01/04/2017: Oliguria No date: Pregnancy induced hypertension     Comment:  first pregnancy  Reproductive/Obstetrics                             Anesthesia Physical Anesthesia Plan  ASA: II  Anesthesia Plan: General   Post-op Pain Management:    Induction: Intravenous  PONV Risk Score and Plan:   Airway Management Planned: Oral ETT  Additional Equipment:   Intra-op Plan:   Post-operative Plan: Extubation in OR  Informed Consent: I have reviewed the patients History and Physical, chart, labs and discussed the procedure including the risks, benefits and alternatives for the proposed anesthesia with the patient or authorized representative who has indicated his/her understanding and acceptance.   Dental advisory given  Plan Discussed with: CRNA and  Surgeon  Anesthesia Plan Comments:         Anesthesia Quick Evaluation

## 2018-09-28 NOTE — Anesthesia Procedure Notes (Signed)
Procedure Name: Intubation Date/Time: 09/28/2018 11:24 AM Performed by: Lavone Orn, CRNA Pre-anesthesia Checklist: Patient identified, Emergency Drugs available, Suction available, Patient being monitored and Timeout performed Patient Re-evaluated:Patient Re-evaluated prior to induction Oxygen Delivery Method: Circle system utilized Preoxygenation: Pre-oxygenation with 100% oxygen Induction Type: IV induction Ventilation: Mask ventilation without difficulty Laryngoscope Size: Mac and 3 Grade View: Grade I Tube type: Oral Number of attempts: 1 Airway Equipment and Method: Stylet (cricoid pressure) Placement Confirmation: ETT inserted through vocal cords under direct vision,  positive ETCO2 and breath sounds checked- equal and bilateral Secured at: 21 cm Tube secured with: Tape Dental Injury: Teeth and Oropharynx as per pre-operative assessment

## 2018-09-28 NOTE — Interval H&P Note (Signed)
History and Physical Interval Note:  09/28/2018 10:46 AM  Carrie ColderHeather C Vandemark  has presented today for surgery, with the diagnosis of chronic choleystitis  The various methods of treatment have been discussed with the patient and family. After consideration of risks, benefits and other options for treatment, the patient has consented to  Procedure(s): ROBOTIC ASSISTED LAPAROSCOPIC CHOLECYSTECTOMY-MULTI SITE (N/A) as a surgical intervention .  The patient's history has been reviewed, patient examined, no change in status, stable for surgery.  I have reviewed the patient's chart and labs.  Questions were answered to the patient's satisfaction.     [name] F [name]

## 2018-09-28 NOTE — Anesthesia Post-op Follow-up Note (Signed)
Anesthesia QCDR form completed.        

## 2018-09-28 NOTE — Op Note (Addendum)
Robotic Assisted laparoscopic Cholecystectomy and umbilical hernia repair  Pre-operative Diagnosis: Chronic cholecistitis  Post-operative Diagnosis: same  Procedure:   Surgeon: Sterling Big, MD FACS  Anesthesia: Gen. with endotracheal tube   Findings: Chronic Cholecystitis  ICG cholangiography showing no evidence of bile leak or any aberrant ductal anatomy Umbilical hernia, reducible   Estimated Blood Loss: 10cc                 Specimens: Gallbladder           Complications: none   Procedure Details  The patient was seen again in the Holding Room. The benefits, complications, treatment options, and expected outcomes were discussed with the patient. The risks of bleeding, infection, recurrence of symptoms, failure to resolve symptoms, bile duct damage, bile duct leak, retained common bile duct stone, bowel injury, any of which could require further surgery and/or ERCP, stent, or papillotomy were reviewed with the patient. The likelihood of improving the patient's symptoms with return to their baseline status is good.  The patient and/or family concurred with the proposed plan, giving informed consent.  The patient was taken to Operating Room, identified as Carrie Marsh and the procedure verified as Laparoscopic Cholecystectomy.  A Time Out was held and the above information confirmed.  Prior to the induction of general anesthesia, antibiotic prophylaxis was administered. VTE prophylaxis was in place. General endotracheal anesthesia was then administered and tolerated well. After the induction, the abdomen was prepped with Chloraprep and draped in the sterile fashion. The patient was positioned in the supine position.   Supraumbilical incision was created and we encountered a hernia.  The hernia sac was dissected free from adjacent structures and was excised. Cut down technique was used to enter the abdominal cavity and a Hasson trochar was placed after two vicryl stitches were  anchored to the fascia. Pneumoperitoneum was then created with CO2 and tolerated well without any adverse changes in the patient's vital signs.  Three 8-mm ports were placed under direct vision. All skin incisions  were infiltrated with a local anesthetic agent before making the incision and placing the trocars.   The patient was positioned  in reverse Trendelenburg position.  The robot was brought to the surgical field and docked in the standard fashion.  We made sure that we kept all instrumentation under direct visualization at all times and there was no evidence of collision between arms  I scrubbed out and went to the console.  The gallbladder was identified, the fundus grasped and retracted cephalad. Adhesions were lysed bluntly. The infundibulum was grasped and retracted laterally, exposing the peritoneum overlying the triangle of Calot. This was then divided and exposed in a blunt fashion. An extended critical view of the cystic duct and cystic artery was obtained.  The cystic duct was clearly identified and bluntly dissected.   Artery and duct were double clipped and divided.  ICG cholangiography was used throughout the procedure with no evidence of any bile leak or a variant anatomy  The gallbladder was taken from the gallbladder fossa in a retrograde fashion with the electrocautery. Hemostasis was obtained with cautery.  There was no evidence of leak.  The robot was undocked and all instrumentation was removed in the standard fashion.   The gallbladder was removed and placed in an Endocatch bag.    No bleeding, bile duct injury or leak, or bowel injury was noted. Pneumoperitoneum was released.   Medical defect was closed with multiple interrupted 0 Ethibond sutures 4-0 subcuticular  Monocryl was used to close the skin. Dermabond was  applied.  The patient was then extubated and brought to the recovery room in stable condition. Sponge, lap, and needle counts were correct at closure and at  the conclusion of the case.               Sterling Bigiego Carrie Farnell, MD, FACS

## 2018-09-28 NOTE — Discharge Instructions (Addendum)
Laparoscopic Cholecystectomy, Care After  ° °These instructions give you information on caring for yourself after your procedure. Your doctor may also give you more specific instructions. Call your doctor if you have any problems or questions after your procedure.  °HOME CARE  °Change your bandages (dressings) as told by your doctor.  °Keep the wound dry and clean. Wash the wound gently with soap and water. Pat the wound dry with a clean towel.  °Do not take baths, swim, or use hot tubs for 2 weeks, or as told by your doctor.  °Only take medicine as told by your doctor.  °Eat a normal diet as told by your doctor.  °Do not lift anything heavier than 10 pounds (4.5 kg) until your doctor says it is okay.  °Do not play contact sports for 1 week, or as told by your doctor. °GET HELP IF:  °Your wound is red, puffy (swollen), or painful.  °You have yellowish-white fluid (pus) coming from the wound.  °You have fluid draining from the wound for more than 1 day.  °You have a bad smell coming from the wound.  °Your wound breaks open. °GET HELP RIGHT AWAY IF:  °You have trouble breathing.  °You have chest pain.  °You have a fever >101  °You have pain in the shoulders (shoulder strap areas) that is getting worse.  °You feel dizzy or pass out (faint).  °You have severe belly (abdominal) pain.  °You feel sick to your stomach (nauseous) or throw up (vomit) for more than 1 day. ° ° ° °AMBULATORY SURGERY  °DISCHARGE INSTRUCTIONS ° ° °1) The drugs that you were given will stay in your system until tomorrow so for the next 24 hours you should not: ° °A) Drive an automobile °B) Make any legal decisions °C) Drink any alcoholic beverage ° ° °2) You may resume regular meals tomorrow.  Today it is better to start with liquids and gradually work up to solid foods. ° °You may eat anything you prefer, but it is better to start with liquids, then soup and crackers, and gradually work up to solid foods. ° ° °3) Please notify your doctor  immediately if you have any unusual bleeding, trouble breathing, redness and pain at the surgery site, drainage, fever, or pain not relieved by medication. ° ° ° °4) Additional Instructions: ° ° ° ° ° ° ° °Please contact your physician with any problems or Same Day Surgery at [phone], Monday through Friday 6 am to 4 pm, or [place] [facility] [place] Main number at [phone]. ° ° °

## 2018-09-28 NOTE — Transfer of Care (Signed)
Immediate Anesthesia Transfer of Care Note  Patient: Carrie Marsh  Procedure(s) Performed: ROBOTIC ASSISTED LAPAROSCOPIC CHOLECYSTECTOMY-MULTI SITE (N/A ) HERNIA REPAIR UMBILICAL ADULT  Patient Location: PACU  Anesthesia Type:General  Level of Consciousness: drowsy  Airway & Oxygen Therapy: Patient Spontanous Breathing and Patient connected to face mask  Post-op Assessment: Report given to RN and Post -op Vital signs reviewed and stable  Post vital signs: stable  Last Vitals:  Vitals Value Taken Time  BP 119/71 09/28/2018 12:55 PM  Temp 36.2 C 09/28/2018 12:55 PM  Pulse 81 09/28/2018  1:03 PM  Resp 18 09/28/2018  1:03 PM  SpO2 100 % 09/28/2018  1:03 PM  Vitals shown include unvalidated device data.  Last Pain:  Vitals:   09/28/18 1255  TempSrc:   PainSc: Asleep         Complications: No apparent anesthesia complications

## 2018-09-29 ENCOUNTER — Encounter: Payer: Self-pay | Admitting: Surgery

## 2018-09-29 NOTE — Anesthesia Postprocedure Evaluation (Signed)
Anesthesia Post Note  Patient: Carrie Marsh  Procedure(s) Performed: ROBOTIC ASSISTED LAPAROSCOPIC CHOLECYSTECTOMY-MULTI SITE (N/A ) HERNIA REPAIR UMBILICAL ADULT  Patient location during evaluation: PACU Anesthesia Type: General Level of consciousness: awake and alert Pain management: pain level controlled Vital Signs Assessment: post-procedure vital signs reviewed and stable Respiratory status: spontaneous breathing, nonlabored ventilation, respiratory function stable and patient connected to nasal cannula oxygen Cardiovascular status: blood pressure returned to baseline and stable Postop Assessment: no apparent nausea or vomiting Anesthetic complications: no     Last Vitals:  Vitals:   09/28/18 1402 09/28/18 1415  BP:  126/77  Pulse: 71 66  Resp: 14 16  Temp: 36.7 C 36.8 C  SpO2: 100% 99%    Last Pain:  Vitals:   09/29/18 0825  TempSrc:   PainSc: 2                  Lenard SimmerAndrew Tomio Kirk

## 2018-10-02 LAB — SURGICAL PATHOLOGY

## 2018-10-11 ENCOUNTER — Encounter: Payer: Self-pay | Admitting: Surgery

## 2018-10-11 ENCOUNTER — Ambulatory Visit (INDEPENDENT_AMBULATORY_CARE_PROVIDER_SITE_OTHER): Payer: BLUE CROSS/BLUE SHIELD | Admitting: Surgery

## 2018-10-11 ENCOUNTER — Other Ambulatory Visit: Payer: Self-pay

## 2018-10-11 VITALS — BP 122/78 | HR 72 | Temp 97.5°F | Resp 16 | Ht 63.0 in | Wt 166.0 lb

## 2018-10-11 DIAGNOSIS — Z09 Encounter for follow-up examination after completed treatment for conditions other than malignant neoplasm: Secondary | ICD-10-CM

## 2018-10-11 NOTE — Patient Instructions (Signed)
Return as needed.The patient is aware to call back for any questions or concerns.  

## 2018-10-11 NOTE — Progress Notes (Signed)
S/p rob chole Doing well Some soreness on lateral port Taking PO, no fever, chills or jaundice Path d/w the pt   PE NAD Abd: soft, nt, incisions c/d/i, no infection  A/P Doing very well No heavy lifting RTC prn

## 2019-03-29 IMAGING — MR MR ABDOMEN WO/W CM MRCP
20 of 22 series · 44 of 48 positions shown · IV contrast (gadavist)
Comparison: Ultrasound right upper quadrant 09/17/2018. CT abdomen
and pelvis 03/26/2015

CLINICAL DATA: Cholelithiasis with dilated common bile duct on
ultrasound.

EXAM:
MRI ABDOMEN WITHOUT AND WITH CONTRAST (INCLUDING MRCP)
TECHNIQUE: Multiplanar multisequence MR imaging of the abdomen was performed
both before and after the administration of intravenous contrast.
Heavily T2-weighted images of the biliary and pancreatic ducts were
obtained, and three-dimensional MRCP images were rendered by post
processing.
CONTRAST:  7 mL Gadavist

[Series 2: bSSFP · coronal · 6.0mm · 0.74mm/px · 2 of 30 slices shown]
[im 1/30]
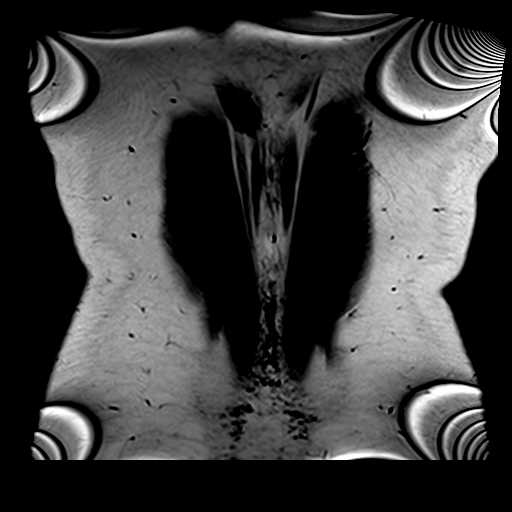
[im 30/30]
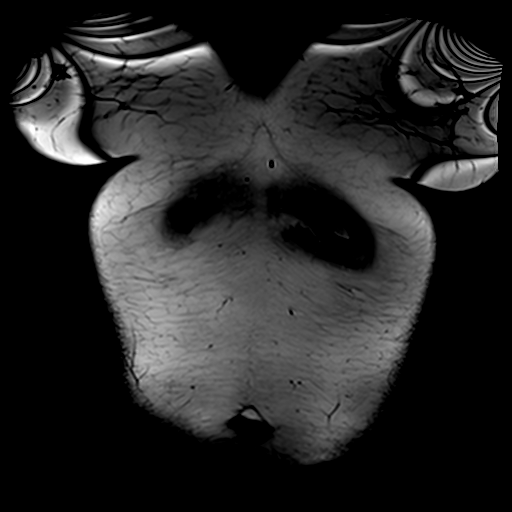

[Series 3: T2 · axial · 6.0mm · 1.19mm/px · z∈[-112,+132]mm · 2 of 35 slices shown]
[im 1/35]
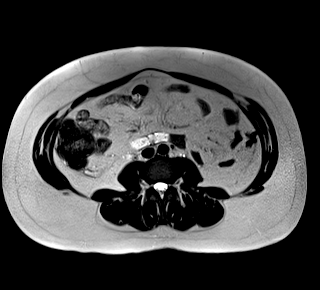
[im 35/35]
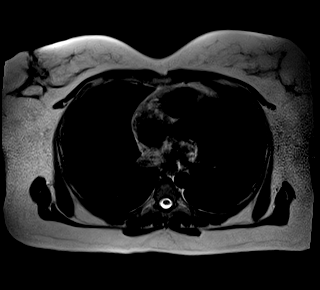

[Series 4: T1 · axial · 6.0mm · 0.78mm/px · z∈[-112,+133]mm · 2 of 35 slices shown (1 of 2)]
[im 1/35]
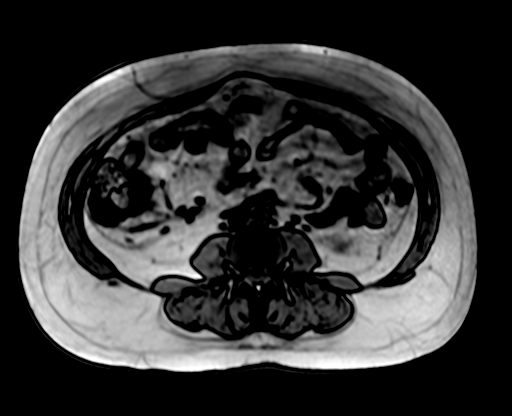
[im 35/35]
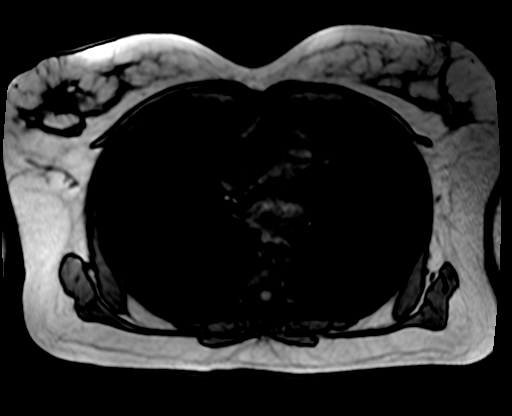

[Series 4: T1 · axial · 6.0mm · 0.78mm/px · z∈[-112,+133]mm · 2 of 35 slices shown (2 of 2)]
[im 1/35]
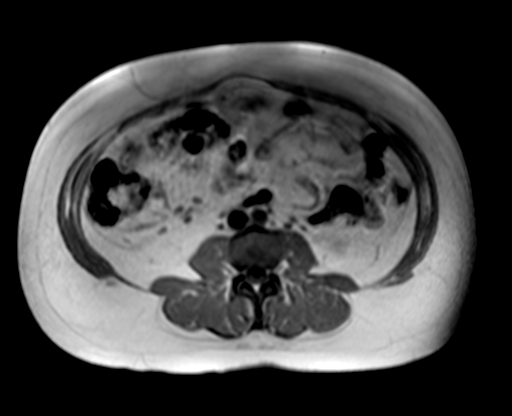
[im 35/35]
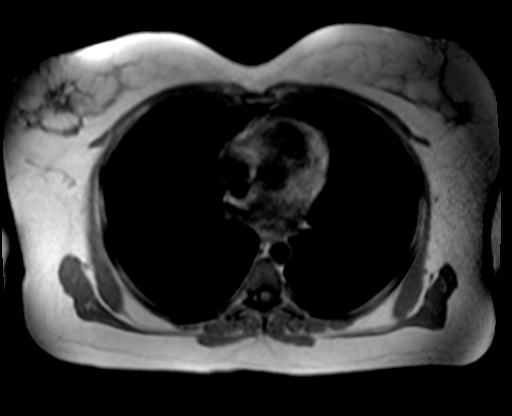

[Series 7: T2 fat-sat · axial · 6.0mm · 1.25mm/px · 1 of 34 slices shown]
[im 1/34]
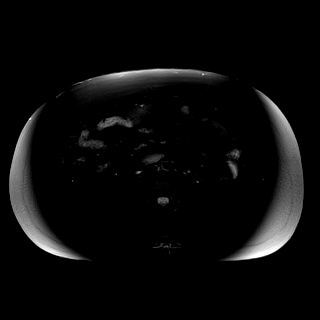

[Series 8: ax dwi_tracew · axial · 6.0mm · 1.49mm/px · 1 of 34 slices shown (1 of 2)]
[im 1/34]
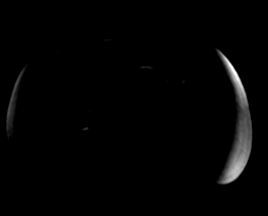

[Series 8: ax dwi_tracew · axial · 6.0mm · 1.49mm/px · 1 of 34 slices shown (2 of 2)]
[im 1/34]
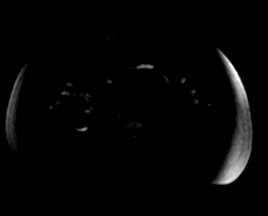

[Series 9: ax dwi_adc · axial · 6.0mm · 1.49mm/px · 1 of 34 slices shown]
[im 1/34]
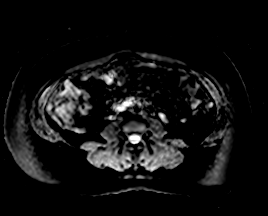

[Series 13: MRCP · coronal · 3.0mm · 1.12mm/px · 1 of 17 slices shown]
[im 1/17]
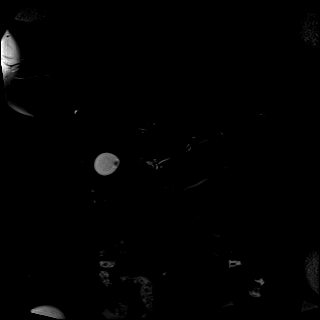

[Series 14: radials · coronal · 50.0mm · 0.78mm/px · 1 of 3 slices shown]
[im 1/3]
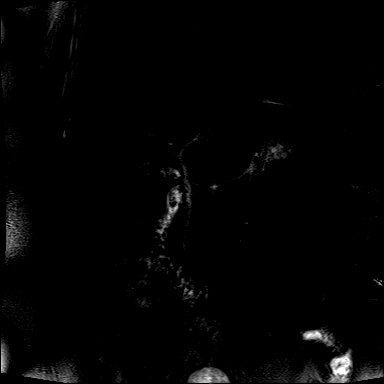

[Series 15: T1 dynamic fat-sat · axial · non-contrast · 3.0mm · 1.25mm/px · z∈[-95,+142]mm · 3 of 80 slices shown (1 of 5)]
[im 1/80]
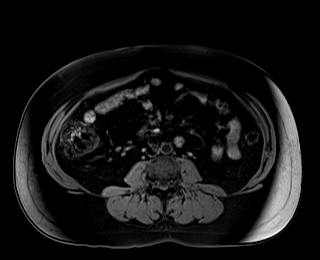
[im 40/80]
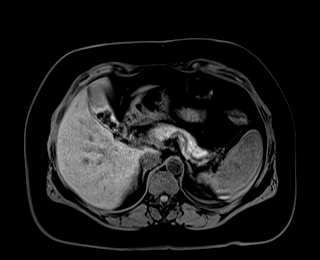
[im 80/80]
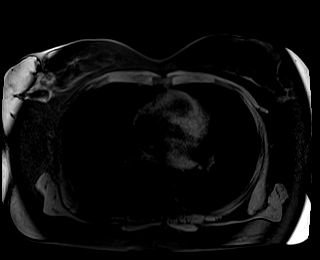

[Series 17: T1 dynamic fat-sat post-contrast · axial · 3.0mm · 1.25mm/px · z∈[-95,+142]mm · 3 of 80 slices shown (1 of 4)]
[im 1/80]
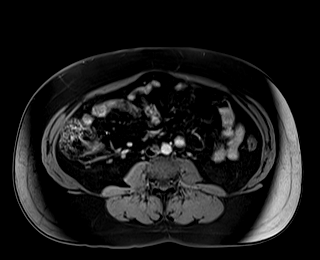
[im 40/80]
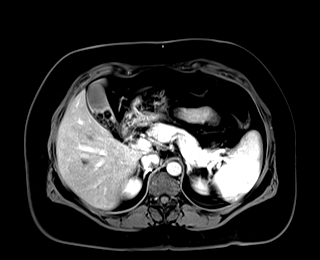
[im 80/80]
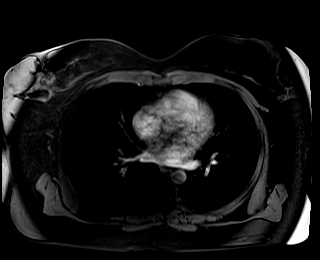

[Series 18: T1 dynamic fat-sat · axial · 3.0mm · 1.25mm/px · z∈[-95,+142]mm · 3 of 80 slices shown (2 of 5)]
[im 1/80]
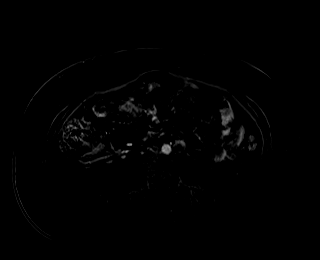
[im 40/80]
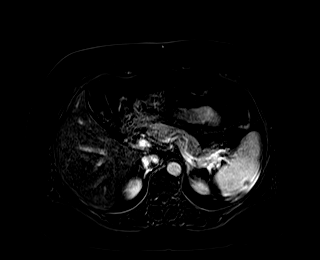
[im 80/80]
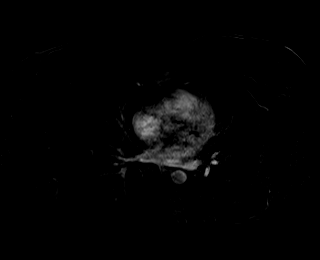

[Series 19: T1 dynamic fat-sat post-contrast · axial · 3.0mm · 1.25mm/px · z∈[-95,+142]mm · 3 of 80 slices shown (2 of 4)]
[im 1/80]
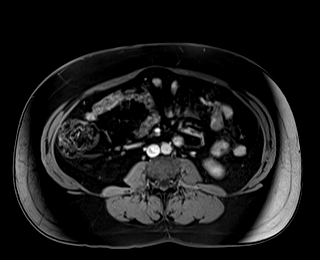
[im 40/80]
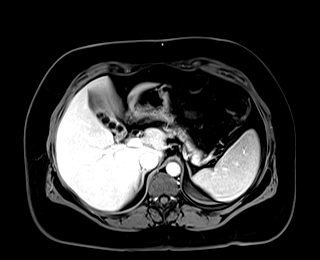
[im 80/80]
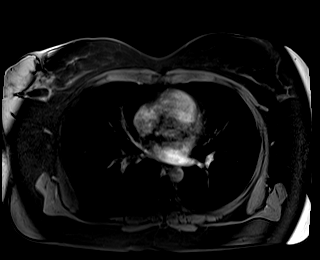

[Series 20: T1 dynamic fat-sat · axial · 3.0mm · 1.25mm/px · z∈[-95,+142]mm · 3 of 80 slices shown (3 of 5)]
[im 1/80]
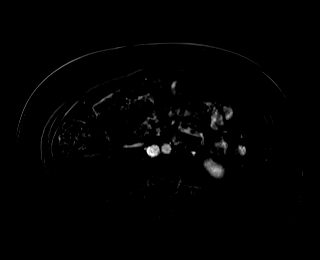
[im 40/80]
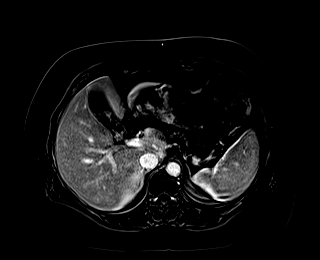
[im 80/80]
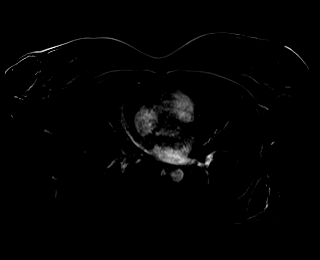

[Series 21: T1 dynamic fat-sat post-contrast · axial · 3.0mm · 1.25mm/px · z∈[-95,+142]mm · 3 of 80 slices shown (3 of 4)]
[im 1/80]
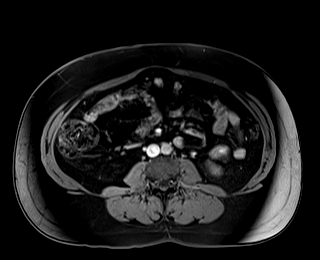
[im 40/80]
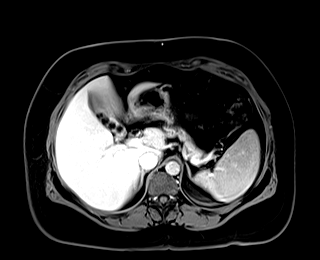
[im 80/80]
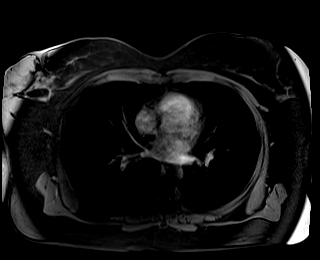

[Series 22: T1 dynamic fat-sat · axial · 3.0mm · 1.25mm/px · z∈[-95,+142]mm · 3 of 80 slices shown (4 of 5)]
[im 1/80]
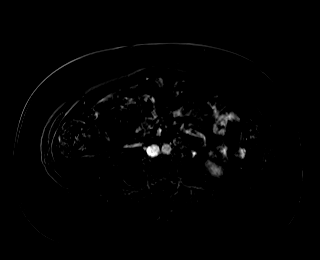
[im 40/80]
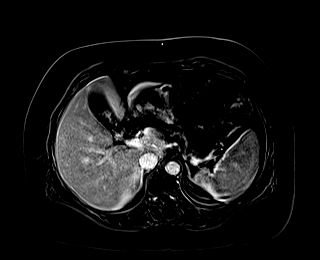
[im 80/80]
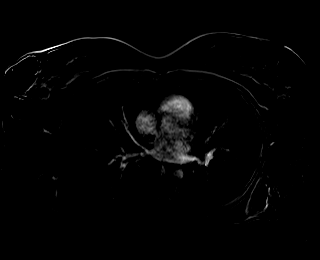

[Series 23: T1 dynamic post-contrast · coronal · 3.0mm · 1.25mm/px · 3 of 64 slices shown]
[im 1/64]
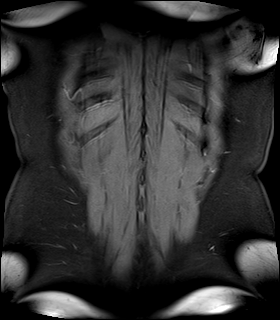
[im 32/64]
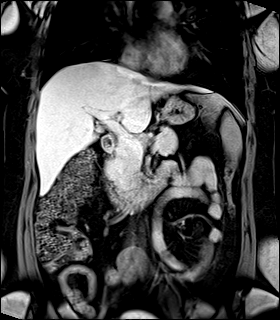
[im 64/64]
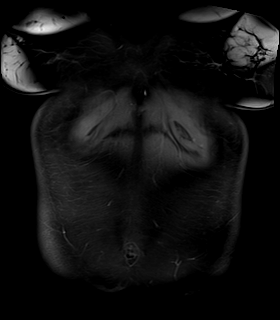

[Series 24: T1 dynamic fat-sat post-contrast · axial · 3.0mm · 1.25mm/px · z∈[-95,+142]mm · 3 of 80 slices shown (4 of 4)]
[im 1/80]
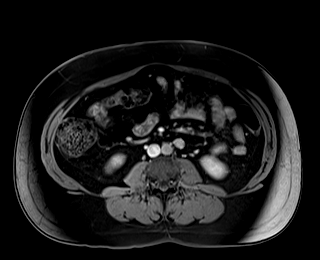
[im 40/80]
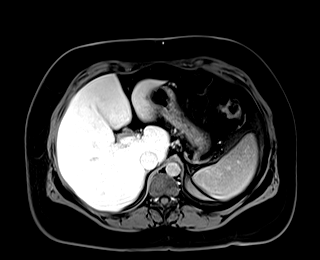
[im 80/80]
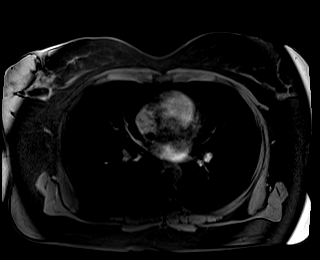

[Series 25: T1 dynamic fat-sat · axial · 3.0mm · 1.25mm/px · z∈[-95,+142]mm · 3 of 80 slices shown (5 of 5)]
[im 1/80]
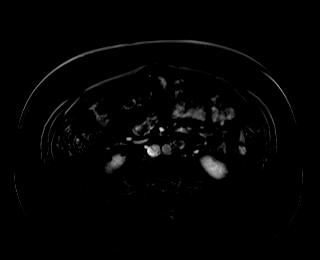
[im 40/80]
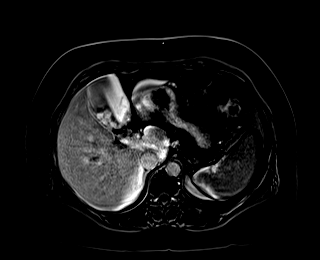
[im 80/80]
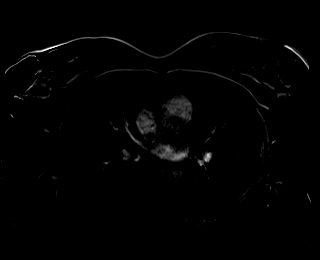

[44 of 48 positions shown; findings below may reference images not displayed]

FINDINGS: Lower chest: Lung bases are clear.

Hepatobiliary: Cholelithiasis with multiple stones in the
gallbladder. No bile duct dilatation. No focal liver lesions.

Pancreas: No mass, inflammatory changes, or other parenchymal
abnormality identified.

Spleen:  Within normal limits in size and appearance.

Adrenals/Urinary Tract: No masses identified. No evidence of
hydronephrosis.

Stomach/Bowel: Visualized portions within the abdomen are
unremarkable.

Vascular/Lymphatic: No pathologically enlarged lymph nodes
identified. No abdominal aortic aneurysm demonstrated.

Other:  No free fluid demonstrated in the upper abdomen.

Musculoskeletal: Marrow signal intensities appear homogeneous and
normal.
IMPRESSION: Cholelithiasis without inflammatory changes. No bile duct
dilatation. No common duct stones identified.

## 2019-03-29 IMAGING — US US ABDOMEN LIMITED
1 series · 14 of 25 positions shown · non-contrast
Comparison: None.

CLINICAL DATA: Abdominal pain with nausea.

EXAM:
ULTRASOUND ABDOMEN LIMITED RIGHT UPPER QUADRANT

[Series 1: us abdomen limited · 14 of 48 slices shown]
[im 1/48]
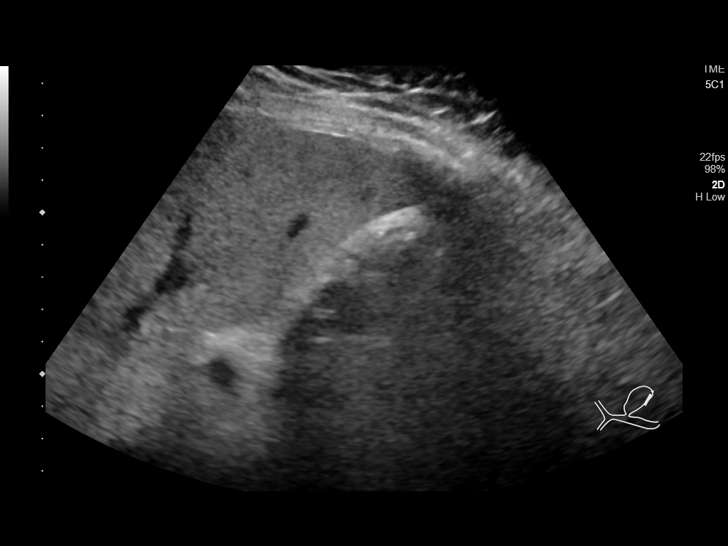
[im 4/48]
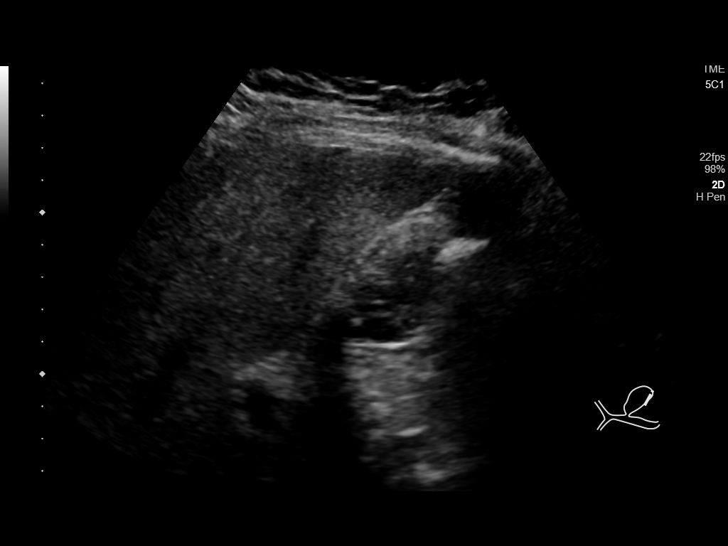
[im 8/48]
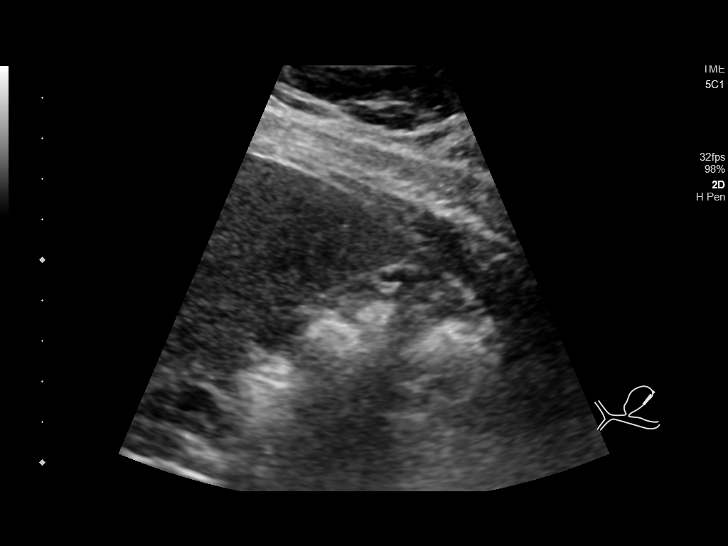
[im 12/48]
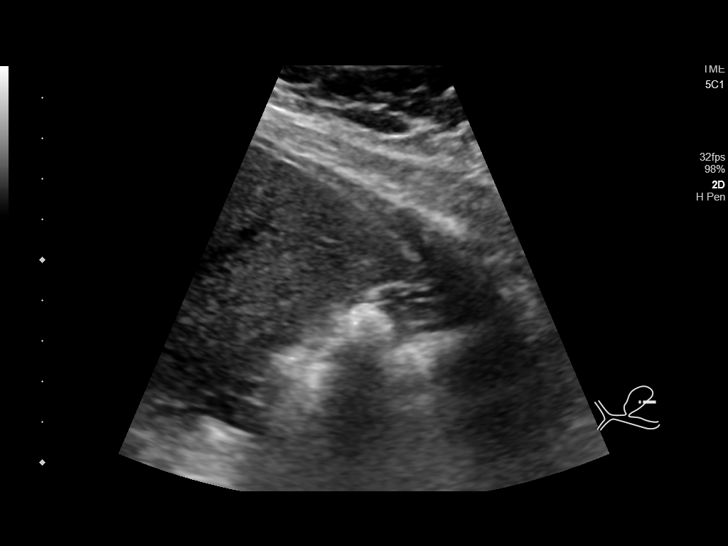
[im 16/48]
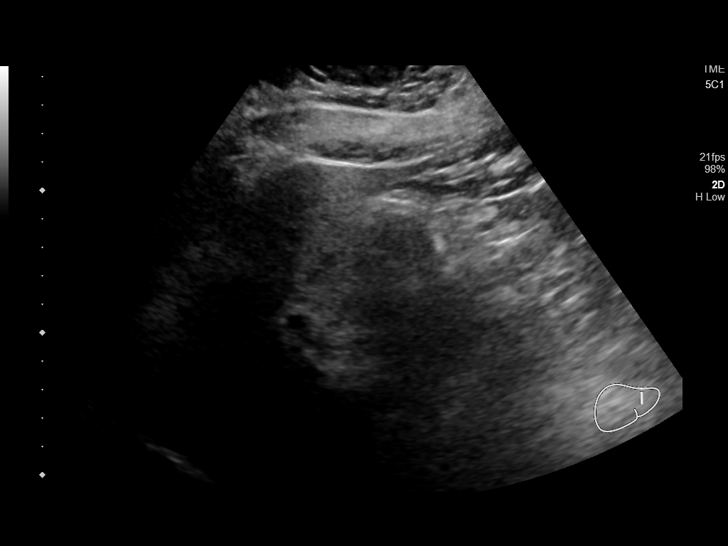
[im 18/48]
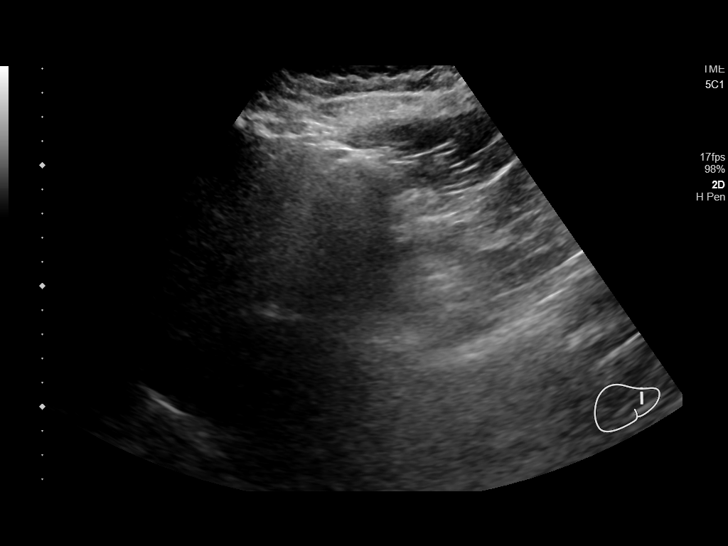
[im 22/48]
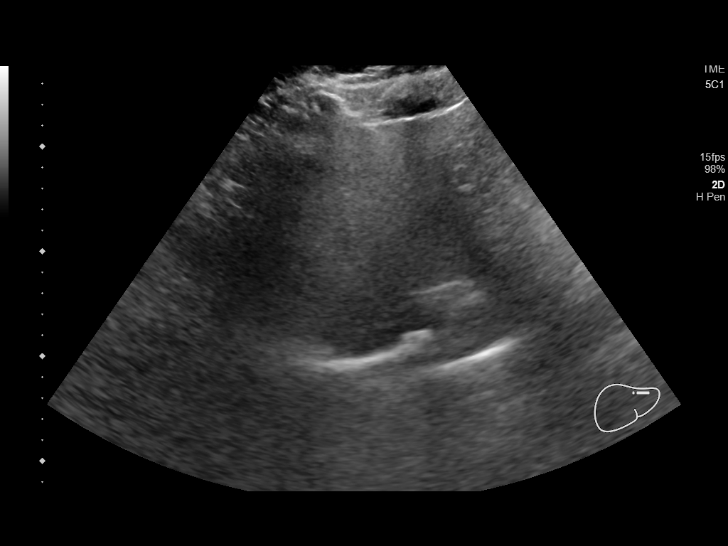
[im 26/48]
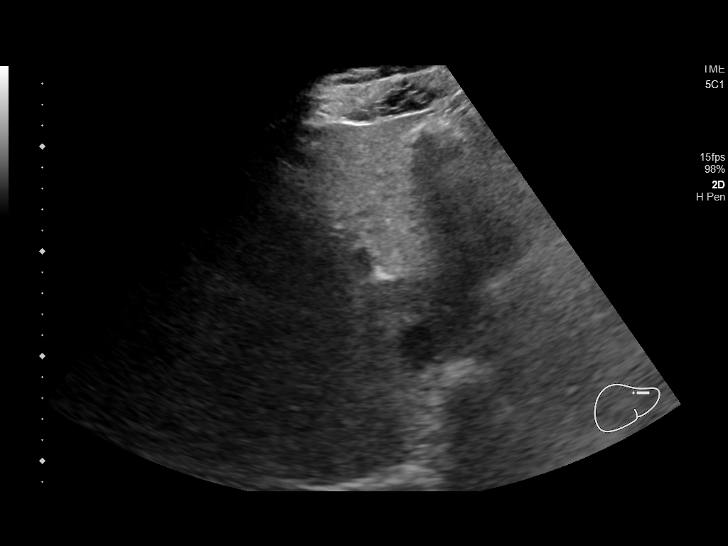
[im 30/48]
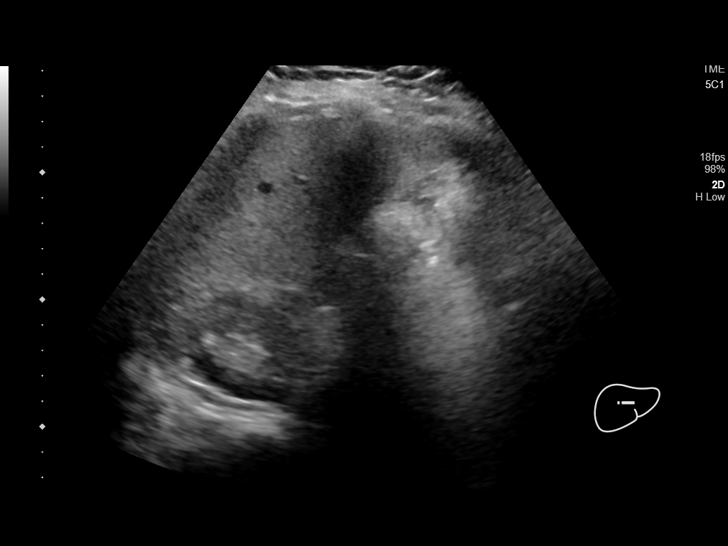
[im 32/48]
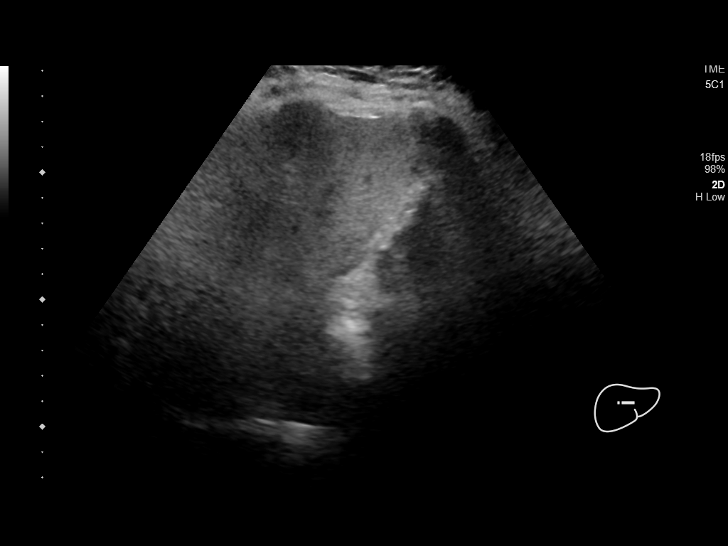
[im 36/48]
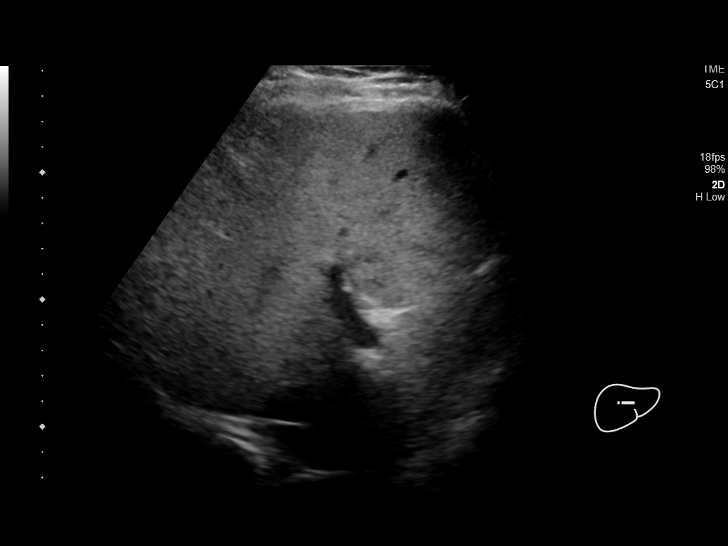
[im 40/48]
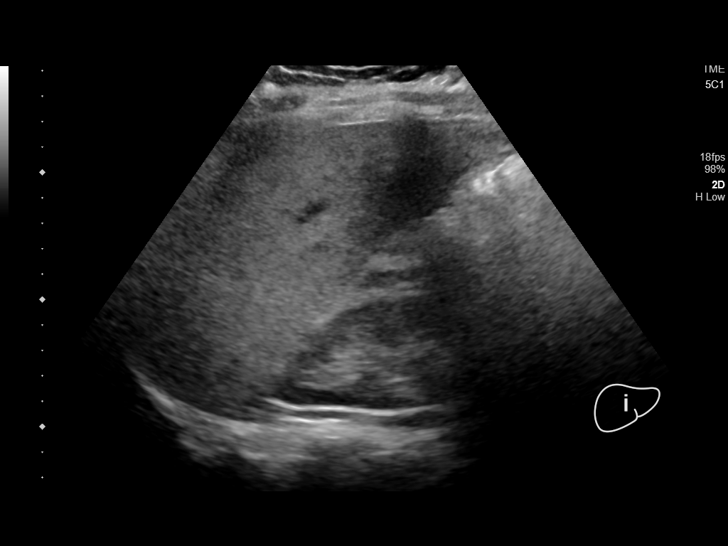
[im 44/48]
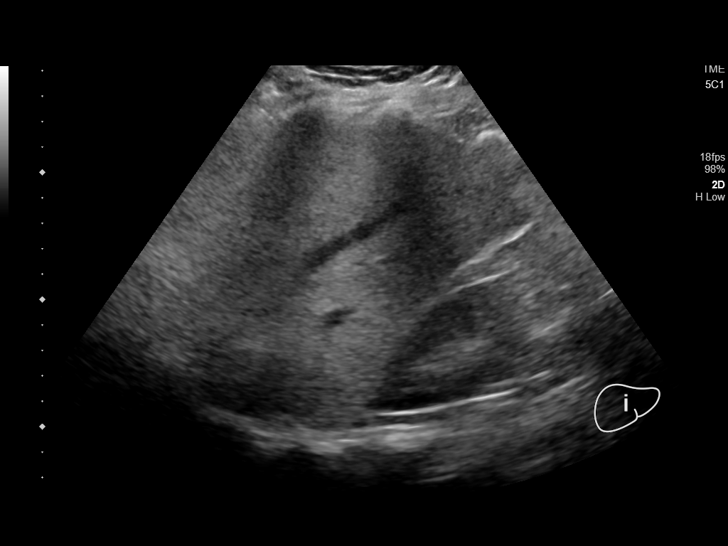
[im 48/48]
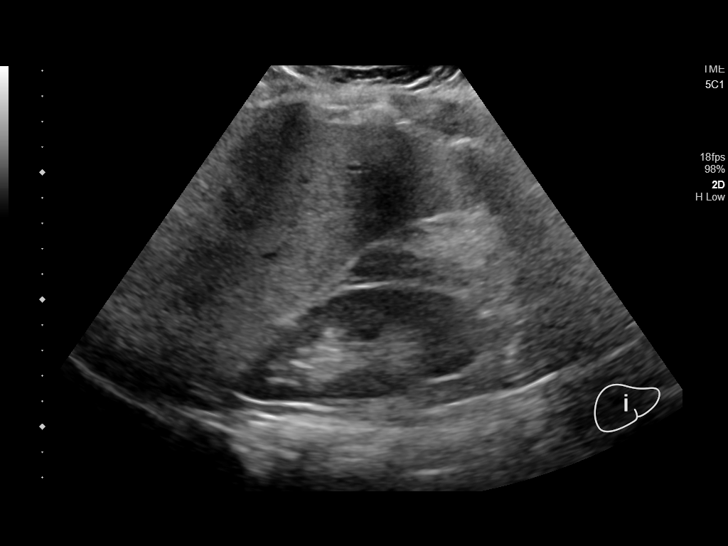

[14 of 25 positions shown; findings below may reference images not displayed]

FINDINGS: Gallbladder:

Gallbladder appears to be filled with stones and/or sludge. Largest
measurable stone is 2 cm greatest dimension. No gallbladder wall
thickening or pericholecystic fluid seen.

Common bile duct:

Diameter: 7 mm

Liver:

Liver is diffusely echogenic indicating fatty infiltration. No focal
mass or lesion demonstrated within the liver. Portal vein is patent
on color Doppler imaging with normal direction of blood flow towards
the liver.
IMPRESSION: 1. Cholelithiasis without evidence of acute cholecystitis.
2. Common bile duct is mildly prominent, measuring 6-7 mm diameter,
raising the possibility of partially obstructing CBD stone or
sludge.
3. Fatty infiltration of the liver.

## 2019-10-01 DIAGNOSIS — J208 Acute bronchitis due to other specified organisms: Secondary | ICD-10-CM | POA: Diagnosis not present

## 2019-10-01 DIAGNOSIS — Z03818 Encounter for observation for suspected exposure to other biological agents ruled out: Secondary | ICD-10-CM | POA: Diagnosis not present

## 2019-10-01 DIAGNOSIS — B9689 Other specified bacterial agents as the cause of diseases classified elsewhere: Secondary | ICD-10-CM | POA: Diagnosis not present

## 2020-02-26 DIAGNOSIS — D485 Neoplasm of uncertain behavior of skin: Secondary | ICD-10-CM | POA: Diagnosis not present

## 2020-02-26 DIAGNOSIS — B079 Viral wart, unspecified: Secondary | ICD-10-CM | POA: Diagnosis not present

## 2020-04-17 DIAGNOSIS — H5210 Myopia, unspecified eye: Secondary | ICD-10-CM | POA: Diagnosis not present

## 2021-01-13 DIAGNOSIS — M9904 Segmental and somatic dysfunction of sacral region: Secondary | ICD-10-CM | POA: Diagnosis not present

## 2021-01-13 DIAGNOSIS — M461 Sacroiliitis, not elsewhere classified: Secondary | ICD-10-CM | POA: Diagnosis not present

## 2021-01-13 DIAGNOSIS — M9903 Segmental and somatic dysfunction of lumbar region: Secondary | ICD-10-CM | POA: Diagnosis not present

## 2021-01-13 DIAGNOSIS — M9901 Segmental and somatic dysfunction of cervical region: Secondary | ICD-10-CM | POA: Diagnosis not present

## 2021-01-13 DIAGNOSIS — M5451 Vertebrogenic low back pain: Secondary | ICD-10-CM | POA: Diagnosis not present

## 2021-01-13 DIAGNOSIS — M5416 Radiculopathy, lumbar region: Secondary | ICD-10-CM | POA: Diagnosis not present

## 2021-01-15 DIAGNOSIS — M5451 Vertebrogenic low back pain: Secondary | ICD-10-CM | POA: Diagnosis not present

## 2021-01-15 DIAGNOSIS — M461 Sacroiliitis, not elsewhere classified: Secondary | ICD-10-CM | POA: Diagnosis not present

## 2021-01-15 DIAGNOSIS — M9901 Segmental and somatic dysfunction of cervical region: Secondary | ICD-10-CM | POA: Diagnosis not present

## 2021-01-15 DIAGNOSIS — M5416 Radiculopathy, lumbar region: Secondary | ICD-10-CM | POA: Diagnosis not present

## 2021-01-15 DIAGNOSIS — M9904 Segmental and somatic dysfunction of sacral region: Secondary | ICD-10-CM | POA: Diagnosis not present

## 2021-01-15 DIAGNOSIS — M9903 Segmental and somatic dysfunction of lumbar region: Secondary | ICD-10-CM | POA: Diagnosis not present

## 2021-01-20 DIAGNOSIS — M9901 Segmental and somatic dysfunction of cervical region: Secondary | ICD-10-CM | POA: Diagnosis not present

## 2021-01-20 DIAGNOSIS — M5416 Radiculopathy, lumbar region: Secondary | ICD-10-CM | POA: Diagnosis not present

## 2021-01-20 DIAGNOSIS — M5451 Vertebrogenic low back pain: Secondary | ICD-10-CM | POA: Diagnosis not present

## 2021-01-20 DIAGNOSIS — M461 Sacroiliitis, not elsewhere classified: Secondary | ICD-10-CM | POA: Diagnosis not present

## 2021-01-20 DIAGNOSIS — M9903 Segmental and somatic dysfunction of lumbar region: Secondary | ICD-10-CM | POA: Diagnosis not present

## 2021-01-20 DIAGNOSIS — M9904 Segmental and somatic dysfunction of sacral region: Secondary | ICD-10-CM | POA: Diagnosis not present

## 2021-01-29 DIAGNOSIS — M9901 Segmental and somatic dysfunction of cervical region: Secondary | ICD-10-CM | POA: Diagnosis not present

## 2021-01-29 DIAGNOSIS — M461 Sacroiliitis, not elsewhere classified: Secondary | ICD-10-CM | POA: Diagnosis not present

## 2021-01-29 DIAGNOSIS — M5451 Vertebrogenic low back pain: Secondary | ICD-10-CM | POA: Diagnosis not present

## 2021-01-29 DIAGNOSIS — M5416 Radiculopathy, lumbar region: Secondary | ICD-10-CM | POA: Diagnosis not present

## 2021-01-29 DIAGNOSIS — M9904 Segmental and somatic dysfunction of sacral region: Secondary | ICD-10-CM | POA: Diagnosis not present

## 2021-01-29 DIAGNOSIS — M9903 Segmental and somatic dysfunction of lumbar region: Secondary | ICD-10-CM | POA: Diagnosis not present

## 2021-02-03 DIAGNOSIS — M5416 Radiculopathy, lumbar region: Secondary | ICD-10-CM | POA: Diagnosis not present

## 2021-02-03 DIAGNOSIS — M461 Sacroiliitis, not elsewhere classified: Secondary | ICD-10-CM | POA: Diagnosis not present

## 2021-02-03 DIAGNOSIS — M5451 Vertebrogenic low back pain: Secondary | ICD-10-CM | POA: Diagnosis not present

## 2021-02-03 DIAGNOSIS — M9903 Segmental and somatic dysfunction of lumbar region: Secondary | ICD-10-CM | POA: Diagnosis not present

## 2021-02-03 DIAGNOSIS — M9904 Segmental and somatic dysfunction of sacral region: Secondary | ICD-10-CM | POA: Diagnosis not present

## 2021-02-03 DIAGNOSIS — M9901 Segmental and somatic dysfunction of cervical region: Secondary | ICD-10-CM | POA: Diagnosis not present

## 2021-02-10 DIAGNOSIS — M9901 Segmental and somatic dysfunction of cervical region: Secondary | ICD-10-CM | POA: Diagnosis not present

## 2021-02-10 DIAGNOSIS — M9903 Segmental and somatic dysfunction of lumbar region: Secondary | ICD-10-CM | POA: Diagnosis not present

## 2021-02-10 DIAGNOSIS — M5451 Vertebrogenic low back pain: Secondary | ICD-10-CM | POA: Diagnosis not present

## 2021-02-10 DIAGNOSIS — M5416 Radiculopathy, lumbar region: Secondary | ICD-10-CM | POA: Diagnosis not present

## 2021-02-10 DIAGNOSIS — M9904 Segmental and somatic dysfunction of sacral region: Secondary | ICD-10-CM | POA: Diagnosis not present

## 2021-02-10 DIAGNOSIS — M461 Sacroiliitis, not elsewhere classified: Secondary | ICD-10-CM | POA: Diagnosis not present

## 2021-03-05 DIAGNOSIS — M9903 Segmental and somatic dysfunction of lumbar region: Secondary | ICD-10-CM | POA: Diagnosis not present

## 2021-03-05 DIAGNOSIS — M9901 Segmental and somatic dysfunction of cervical region: Secondary | ICD-10-CM | POA: Diagnosis not present

## 2021-03-05 DIAGNOSIS — M5451 Vertebrogenic low back pain: Secondary | ICD-10-CM | POA: Diagnosis not present

## 2021-03-05 DIAGNOSIS — M5416 Radiculopathy, lumbar region: Secondary | ICD-10-CM | POA: Diagnosis not present

## 2021-03-05 DIAGNOSIS — M461 Sacroiliitis, not elsewhere classified: Secondary | ICD-10-CM | POA: Diagnosis not present

## 2021-03-05 DIAGNOSIS — M9904 Segmental and somatic dysfunction of sacral region: Secondary | ICD-10-CM | POA: Diagnosis not present

## 2021-03-10 DIAGNOSIS — M461 Sacroiliitis, not elsewhere classified: Secondary | ICD-10-CM | POA: Diagnosis not present

## 2021-03-10 DIAGNOSIS — M5416 Radiculopathy, lumbar region: Secondary | ICD-10-CM | POA: Diagnosis not present

## 2021-03-10 DIAGNOSIS — M5451 Vertebrogenic low back pain: Secondary | ICD-10-CM | POA: Diagnosis not present

## 2021-03-10 DIAGNOSIS — M9903 Segmental and somatic dysfunction of lumbar region: Secondary | ICD-10-CM | POA: Diagnosis not present

## 2021-03-10 DIAGNOSIS — M9901 Segmental and somatic dysfunction of cervical region: Secondary | ICD-10-CM | POA: Diagnosis not present

## 2021-03-10 DIAGNOSIS — M9904 Segmental and somatic dysfunction of sacral region: Secondary | ICD-10-CM | POA: Diagnosis not present

## 2021-03-17 DIAGNOSIS — M5416 Radiculopathy, lumbar region: Secondary | ICD-10-CM | POA: Diagnosis not present

## 2021-03-17 DIAGNOSIS — M461 Sacroiliitis, not elsewhere classified: Secondary | ICD-10-CM | POA: Diagnosis not present

## 2021-03-17 DIAGNOSIS — M9901 Segmental and somatic dysfunction of cervical region: Secondary | ICD-10-CM | POA: Diagnosis not present

## 2021-03-17 DIAGNOSIS — M5451 Vertebrogenic low back pain: Secondary | ICD-10-CM | POA: Diagnosis not present

## 2021-03-17 DIAGNOSIS — M9904 Segmental and somatic dysfunction of sacral region: Secondary | ICD-10-CM | POA: Diagnosis not present

## 2021-03-17 DIAGNOSIS — M9903 Segmental and somatic dysfunction of lumbar region: Secondary | ICD-10-CM | POA: Diagnosis not present

## 2021-03-24 DIAGNOSIS — M461 Sacroiliitis, not elsewhere classified: Secondary | ICD-10-CM | POA: Diagnosis not present

## 2021-03-24 DIAGNOSIS — M9903 Segmental and somatic dysfunction of lumbar region: Secondary | ICD-10-CM | POA: Diagnosis not present

## 2021-03-24 DIAGNOSIS — M9901 Segmental and somatic dysfunction of cervical region: Secondary | ICD-10-CM | POA: Diagnosis not present

## 2021-03-24 DIAGNOSIS — M9904 Segmental and somatic dysfunction of sacral region: Secondary | ICD-10-CM | POA: Diagnosis not present

## 2021-03-24 DIAGNOSIS — M5451 Vertebrogenic low back pain: Secondary | ICD-10-CM | POA: Diagnosis not present

## 2021-03-24 DIAGNOSIS — M5416 Radiculopathy, lumbar region: Secondary | ICD-10-CM | POA: Diagnosis not present

## 2021-04-07 DIAGNOSIS — M5416 Radiculopathy, lumbar region: Secondary | ICD-10-CM | POA: Diagnosis not present

## 2021-04-07 DIAGNOSIS — M9904 Segmental and somatic dysfunction of sacral region: Secondary | ICD-10-CM | POA: Diagnosis not present

## 2021-04-07 DIAGNOSIS — M461 Sacroiliitis, not elsewhere classified: Secondary | ICD-10-CM | POA: Diagnosis not present

## 2021-04-07 DIAGNOSIS — M9903 Segmental and somatic dysfunction of lumbar region: Secondary | ICD-10-CM | POA: Diagnosis not present

## 2021-04-07 DIAGNOSIS — M5451 Vertebrogenic low back pain: Secondary | ICD-10-CM | POA: Diagnosis not present

## 2021-04-07 DIAGNOSIS — M9901 Segmental and somatic dysfunction of cervical region: Secondary | ICD-10-CM | POA: Diagnosis not present

## 2021-05-07 DIAGNOSIS — H5210 Myopia, unspecified eye: Secondary | ICD-10-CM | POA: Diagnosis not present

## 2023-02-21 DIAGNOSIS — T148XXA Other injury of unspecified body region, initial encounter: Secondary | ICD-10-CM | POA: Diagnosis not present

## 2023-02-21 DIAGNOSIS — L03221 Cellulitis of neck: Secondary | ICD-10-CM | POA: Diagnosis not present

## 2023-08-03 DIAGNOSIS — J209 Acute bronchitis, unspecified: Secondary | ICD-10-CM | POA: Diagnosis not present

## 2023-08-03 DIAGNOSIS — J9801 Acute bronchospasm: Secondary | ICD-10-CM | POA: Diagnosis not present

## 2023-08-03 DIAGNOSIS — Z6832 Body mass index (BMI) 32.0-32.9, adult: Secondary | ICD-10-CM | POA: Diagnosis not present

## 2023-08-03 DIAGNOSIS — J189 Pneumonia, unspecified organism: Secondary | ICD-10-CM | POA: Diagnosis not present

## 2023-08-03 DIAGNOSIS — R030 Elevated blood-pressure reading, without diagnosis of hypertension: Secondary | ICD-10-CM | POA: Diagnosis not present

## 2023-08-03 DIAGNOSIS — J101 Influenza due to other identified influenza virus with other respiratory manifestations: Secondary | ICD-10-CM | POA: Diagnosis not present

## 2023-08-03 DIAGNOSIS — U071 COVID-19: Secondary | ICD-10-CM | POA: Diagnosis not present

## 2023-08-03 DIAGNOSIS — E669 Obesity, unspecified: Secondary | ICD-10-CM | POA: Diagnosis not present

## 2023-11-08 DIAGNOSIS — E612 Magnesium deficiency: Secondary | ICD-10-CM | POA: Diagnosis not present

## 2023-11-08 DIAGNOSIS — Z131 Encounter for screening for diabetes mellitus: Secondary | ICD-10-CM | POA: Diagnosis not present

## 2023-11-08 DIAGNOSIS — E611 Iron deficiency: Secondary | ICD-10-CM | POA: Diagnosis not present

## 2023-11-08 DIAGNOSIS — D519 Vitamin B12 deficiency anemia, unspecified: Secondary | ICD-10-CM | POA: Diagnosis not present

## 2023-11-08 DIAGNOSIS — R519 Headache, unspecified: Secondary | ICD-10-CM | POA: Diagnosis not present

## 2023-11-08 DIAGNOSIS — E039 Hypothyroidism, unspecified: Secondary | ICD-10-CM | POA: Diagnosis not present

## 2023-11-08 DIAGNOSIS — F419 Anxiety disorder, unspecified: Secondary | ICD-10-CM | POA: Diagnosis not present

## 2023-11-08 DIAGNOSIS — G4700 Insomnia, unspecified: Secondary | ICD-10-CM | POA: Diagnosis not present

## 2023-11-08 DIAGNOSIS — Z1329 Encounter for screening for other suspected endocrine disorder: Secondary | ICD-10-CM | POA: Diagnosis not present

## 2023-11-08 DIAGNOSIS — R5383 Other fatigue: Secondary | ICD-10-CM | POA: Diagnosis not present

## 2023-11-08 DIAGNOSIS — M2550 Pain in unspecified joint: Secondary | ICD-10-CM | POA: Diagnosis not present

## 2023-11-08 DIAGNOSIS — R4189 Other symptoms and signs involving cognitive functions and awareness: Secondary | ICD-10-CM | POA: Diagnosis not present

## 2023-11-08 DIAGNOSIS — Z13220 Encounter for screening for lipoid disorders: Secondary | ICD-10-CM | POA: Diagnosis not present

## 2023-11-08 DIAGNOSIS — E559 Vitamin D deficiency, unspecified: Secondary | ICD-10-CM | POA: Diagnosis not present

## 2023-11-08 DIAGNOSIS — E663 Overweight: Secondary | ICD-10-CM | POA: Diagnosis not present

## 2023-12-08 DIAGNOSIS — R519 Headache, unspecified: Secondary | ICD-10-CM | POA: Diagnosis not present

## 2023-12-08 DIAGNOSIS — E611 Iron deficiency: Secondary | ICD-10-CM | POA: Diagnosis not present

## 2023-12-08 DIAGNOSIS — E663 Overweight: Secondary | ICD-10-CM | POA: Diagnosis not present

## 2023-12-08 DIAGNOSIS — E039 Hypothyroidism, unspecified: Secondary | ICD-10-CM | POA: Diagnosis not present

## 2023-12-08 DIAGNOSIS — Z131 Encounter for screening for diabetes mellitus: Secondary | ICD-10-CM | POA: Diagnosis not present

## 2023-12-08 DIAGNOSIS — Z13220 Encounter for screening for lipoid disorders: Secondary | ICD-10-CM | POA: Diagnosis not present

## 2023-12-08 DIAGNOSIS — E559 Vitamin D deficiency, unspecified: Secondary | ICD-10-CM | POA: Diagnosis not present

## 2023-12-08 DIAGNOSIS — F419 Anxiety disorder, unspecified: Secondary | ICD-10-CM | POA: Diagnosis not present

## 2023-12-08 DIAGNOSIS — D519 Vitamin B12 deficiency anemia, unspecified: Secondary | ICD-10-CM | POA: Diagnosis not present

## 2023-12-08 DIAGNOSIS — G4700 Insomnia, unspecified: Secondary | ICD-10-CM | POA: Diagnosis not present

## 2023-12-08 DIAGNOSIS — R5383 Other fatigue: Secondary | ICD-10-CM | POA: Diagnosis not present

## 2023-12-08 DIAGNOSIS — Z1329 Encounter for screening for other suspected endocrine disorder: Secondary | ICD-10-CM | POA: Diagnosis not present

## 2024-02-20 DIAGNOSIS — R519 Headache, unspecified: Secondary | ICD-10-CM | POA: Diagnosis not present

## 2024-02-20 DIAGNOSIS — D519 Vitamin B12 deficiency anemia, unspecified: Secondary | ICD-10-CM | POA: Diagnosis not present

## 2024-02-20 DIAGNOSIS — E611 Iron deficiency: Secondary | ICD-10-CM | POA: Diagnosis not present

## 2024-02-20 DIAGNOSIS — R5383 Other fatigue: Secondary | ICD-10-CM | POA: Diagnosis not present

## 2024-02-20 DIAGNOSIS — E559 Vitamin D deficiency, unspecified: Secondary | ICD-10-CM | POA: Diagnosis not present

## 2024-02-20 DIAGNOSIS — Z131 Encounter for screening for diabetes mellitus: Secondary | ICD-10-CM | POA: Diagnosis not present

## 2024-02-20 DIAGNOSIS — G4700 Insomnia, unspecified: Secondary | ICD-10-CM | POA: Diagnosis not present

## 2024-02-20 DIAGNOSIS — Z13220 Encounter for screening for lipoid disorders: Secondary | ICD-10-CM | POA: Diagnosis not present

## 2024-02-20 DIAGNOSIS — Z1329 Encounter for screening for other suspected endocrine disorder: Secondary | ICD-10-CM | POA: Diagnosis not present

## 2024-02-20 DIAGNOSIS — E039 Hypothyroidism, unspecified: Secondary | ICD-10-CM | POA: Diagnosis not present

## 2024-02-20 DIAGNOSIS — F419 Anxiety disorder, unspecified: Secondary | ICD-10-CM | POA: Diagnosis not present

## 2024-02-20 DIAGNOSIS — E663 Overweight: Secondary | ICD-10-CM | POA: Diagnosis not present

## 2024-03-13 DIAGNOSIS — Z1329 Encounter for screening for other suspected endocrine disorder: Secondary | ICD-10-CM | POA: Diagnosis not present

## 2024-03-13 DIAGNOSIS — G4700 Insomnia, unspecified: Secondary | ICD-10-CM | POA: Diagnosis not present

## 2024-03-13 DIAGNOSIS — Z13220 Encounter for screening for lipoid disorders: Secondary | ICD-10-CM | POA: Diagnosis not present

## 2024-03-13 DIAGNOSIS — R519 Headache, unspecified: Secondary | ICD-10-CM | POA: Diagnosis not present

## 2024-03-13 DIAGNOSIS — R5383 Other fatigue: Secondary | ICD-10-CM | POA: Diagnosis not present

## 2024-03-13 DIAGNOSIS — E559 Vitamin D deficiency, unspecified: Secondary | ICD-10-CM | POA: Diagnosis not present

## 2024-03-13 DIAGNOSIS — D519 Vitamin B12 deficiency anemia, unspecified: Secondary | ICD-10-CM | POA: Diagnosis not present

## 2024-03-13 DIAGNOSIS — E039 Hypothyroidism, unspecified: Secondary | ICD-10-CM | POA: Diagnosis not present

## 2024-03-13 DIAGNOSIS — E663 Overweight: Secondary | ICD-10-CM | POA: Diagnosis not present

## 2024-03-13 DIAGNOSIS — E611 Iron deficiency: Secondary | ICD-10-CM | POA: Diagnosis not present

## 2024-03-13 DIAGNOSIS — F419 Anxiety disorder, unspecified: Secondary | ICD-10-CM | POA: Diagnosis not present

## 2024-03-13 DIAGNOSIS — Z131 Encounter for screening for diabetes mellitus: Secondary | ICD-10-CM | POA: Diagnosis not present

## 2024-05-09 ENCOUNTER — Encounter (HOSPITAL_BASED_OUTPATIENT_CLINIC_OR_DEPARTMENT_OTHER): Payer: Self-pay | Admitting: Emergency Medicine

## 2024-05-09 ENCOUNTER — Emergency Department (HOSPITAL_BASED_OUTPATIENT_CLINIC_OR_DEPARTMENT_OTHER)
Admission: EM | Admit: 2024-05-09 | Discharge: 2024-05-09 | Disposition: A | Discharge status: Home / Self Care | Attending: Emergency Medicine | Admitting: Emergency Medicine

## 2024-05-09 ENCOUNTER — Other Ambulatory Visit: Payer: Self-pay

## 2024-05-09 ENCOUNTER — Emergency Department (HOSPITAL_BASED_OUTPATIENT_CLINIC_OR_DEPARTMENT_OTHER)

## 2024-05-09 DIAGNOSIS — R519 Headache, unspecified: Secondary | ICD-10-CM | POA: Insufficient documentation

## 2024-05-09 DIAGNOSIS — Z79899 Other long term (current) drug therapy: Secondary | ICD-10-CM | POA: Insufficient documentation

## 2024-05-09 LAB — BASIC METABOLIC PANEL WITH GFR
Anion gap: 11 (ref 5–15)
BUN: 11 mg/dL (ref 6–20)
CO2: 26 mmol/L (ref 22–32)
Calcium: 9.5 mg/dL (ref 8.9–10.3)
Chloride: 105 mmol/L (ref 98–111)
Creatinine, Ser: 0.78 mg/dL (ref 0.44–1.00)
GFR, Estimated: >60 mL/min (ref >60)
Glucose, Bld: 97 mg/dL (ref 70–99)
Potassium: 3.4 mmol/L — ABNORMAL LOW (ref 3.5–5.1)
Sodium: 142 mmol/L (ref 135–145)

## 2024-05-09 LAB — CBC
HCT: 38.6 % (ref 36.0–46.0)
Hemoglobin: 13.9 g/dL (ref 12.0–15.0)
MCH: 31 pg (ref 26.0–34.0)
MCHC: 36 g/dL (ref 30.0–36.0)
MCV: 86.2 fL (ref 80.0–100.0)
Platelets: 297 K/uL (ref 150–400)
RBC: 4.48 MIL/uL (ref 3.87–5.11)
RDW: 12.8 % (ref 11.5–15.5)
WBC: 9.2 K/uL (ref 4.0–10.5)
nRBC: 0 % (ref 0.0–0.2)

## 2024-05-09 LAB — PREGNANCY, URINE: Preg Test, Ur: NEGATIVE

## 2024-05-09 MED ORDER — METOCLOPRAMIDE HCL 5 MG/ML IJ SOLN
10.0000 mg | Freq: Once | INTRAMUSCULAR | Status: AC
  Administered 2024-05-09: 10 mg via INTRAVENOUS
  Filled 2024-05-09: qty 2

## 2024-05-09 MED ORDER — DIPHENHYDRAMINE HCL 50 MG/ML IJ SOLN
12.5000 mg | Freq: Once | INTRAMUSCULAR | Status: AC
  Administered 2024-05-09: 12.5 mg via INTRAVENOUS
  Filled 2024-05-09: qty 1

## 2024-05-09 MED ORDER — SODIUM CHLORIDE 0.9 % IV BOLUS
1000.0000 mL | Freq: Once | INTRAVENOUS | Status: AC
  Administered 2024-05-09: 1000 mL via INTRAVENOUS

## 2024-05-09 MED ORDER — IOHEXOL 350 MG/ML SOLN
75.0000 mL | Freq: Once | INTRAVENOUS | Status: AC | PRN
  Administered 2024-05-09: 75 mL via INTRAVENOUS

## 2024-05-09 NOTE — ED Triage Notes (Signed)
 Reports sudden onset headache back of head Started Sunday during intercourse Went away Experienced another similar headache last night, has not gone away  Took ibuprofen  800mg  tonight around 6pm no relief Some nausea

## 2024-05-09 NOTE — ED Provider Notes (Signed)
 Patient with post-coital headache, awaiting SAH r/o. Pending CT head Physical Exam  BP (!) 161/87   Pulse 64   Temp (P) 98.6 F (37 C) (Oral)   Resp (P) 20   SpO2 97%   Physical Exam Vitals and nursing note reviewed.  Constitutional:      Appearance: Normal appearance.  HENT:     Head: Atraumatic.  Pulmonary:     Effort: Pulmonary effort is normal.  Neurological:     General: No focal deficit present.     Mental Status: She is alert.  Psychiatric:        Mood and Affect: Mood normal.        Behavior: Behavior normal.     Procedures  Procedures  ED Course / MDM    Medical Decision Making Amount and/or Complexity of Data Reviewed Labs: ordered. Radiology: ordered.  Risk Prescription drug management.     Patient accepted at shift change.  Please see prior note for full details.  In short, patient presenting with concern for a postcoital headache that started last evening and was persistent into today.  States this was a generalized pain and the pain across her forehead.  She denied any vision changes, light or sound sensitivity.  No pain in her neck or fevers.   Patient CTA head and neck without acute abnormality.  Her CBC is without abnormality.  BMP with mild hypokalemia at 3.4, otherwise unremarkable.  Pregnancy test is negative.  She was treated for her headache by the previous team with Reglan , Benadryl , and NS bolus.  She reports improvement in pain with the medications given.  She states she still has a mild headache on the right side, but the pain in the left side has resolved.  She feels like she can manage this at home now.  Remains without any neurodeficits, stable vitals, no concern for other emergent etiologies.  No fever or neck pain, no concern for meningitis at this time.  We discussed that she will need to follow-up with her PCP to discuss further treatment options for postcoital headache and to follow up on elevated blood pressure here today.  May  continue with Tylenol  and ibuprofen  as needed for pain.  Return precautions given     Madasyn Heath, PA-C 05/09/24 2317    Emil Share, DO 05/10/24 1456

## 2024-05-09 NOTE — Discharge Instructions (Signed)
 You were seen today for your headache This may have been what is known as a post-coital headache.   You may take up to 1000mg  of tylenol  every 6 hours as needed for headache. Do not take more then 4g per day.   You may use up to 600mg  ibuprofen  every 6 hours as needed for headache.  Do not exceed 2.4g of ibuprofen  per day.  There are some medications like propranolol and sumatriptan which can be used for prevention. Please discuss with your PCP if this may be a good option for you.  Tests performed today include: CT of your head which was normal and did not show any serious cause of your headache Your blood counts are normal today Your potassium was slightly low.  Your other electrolytes were normal today  Follow-up instructions: Please follow-up with your primary care provider if your headaches are not being controlled with over-the-counter medications like Tylenol  and ibuprofen  or becoming more frequent.  Your blood pressure was also elevated today.  Please record your blood pressures at home and follow-up with your PCP within the next month to discuss blood pressure control.  Return instructions:  Please return to the Emergency Department if you experience worsening symptoms. Return if the medications do not resolve your headache, if it recurs, or if you have multiple episodes of vomiting or cannot keep down fluids. Return if you have a change from the usual headache. RETURN IMMEDIATELY IF you: Develop a sudden, severe headache Develop confusion or become poorly responsive or faint Develop a fever above 100.52F or problem breathing Have a change in speech, vision, swallowing, or understanding Develop new weakness, numbness, tingling, incoordination in your arms or legs Have a seizure Have lots of pain in your neck Please return if you have any other emergent concerns.

## 2024-05-09 NOTE — ED Provider Notes (Signed)
 [place] EMERGENCY DEPARTMENT AT [facility] Provider Note   CSN: [id number] Arrival date & time: 05/09/24  2005     Patient presents with: Headache  HPI ELLYANNA Marsh is a 38 y.o. female presenting for headache.  Started Sunday during intercourse.  Pain is primarily in the back of the head.  She states that that headache did resolve but Monday night she had intercourse again and the headache returned this time it was more generalized all about the forehead and scalp.  She denies visual disturbance, photophobia or phonophobia.  Denies fever or nuchal rigidity.  Denies any weakness or numbness in her extremities.  Past Medical History:  Diagnosis Date   Anxiety    NO MEDS   Dehydration 01/04/2017   Depression 2018   PPD   GERD (gastroesophageal reflux disease)    NO MEDS   Headache    MIGRAINES   History of gestational hypertension 01/04/2017   Ketonuria 01/04/2017   Oliguria 01/04/2017   Pregnancy induced hypertension    first pregnancy       Headache      Prior to Admission medications   Medication Sig Start Date End Date Taking? Authorizing Provider  HYDROcodone -acetaminophen  (NORCO/VICODIN) 5-325 MG tablet Take 1 tablet by mouth every 6 (six) hours as needed for moderate pain. 09/28/18   Pabon, Laneta FALCON, MD  oxyCODONE -acetaminophen  (PERCOCET/ROXICET) 5-325 MG tablet Take 1 tablet by mouth every 4 (four) hours as needed for severe pain. Patient not taking: Reported on 09/28/2018 09/17/18   Sung, Jade J, MD  prochlorperazine  (COMPAZINE ) 10 MG tablet Take 1 tablet (10 mg total) by mouth every 6 (six) hours as needed for nausea or vomiting. 09/18/18   Pabon, Laneta FALCON, MD    Allergies: Patient has no known allergies.    Review of Systems  Neurological:  Positive for headaches.    Updated Vital Signs BP (!) 148/87   Pulse 64   Temp (P) 98.6 F (37 C) (Oral)   Resp (P) 20   SpO2 97%   Physical Exam Vitals and nursing note reviewed.  HENT:     Head:  Normocephalic and atraumatic.     Mouth/Throat:     Mouth: Mucous membranes are moist.  Eyes:     General:        Right eye: No discharge.        Left eye: No discharge.     Conjunctiva/sclera: Conjunctivae normal.  Cardiovascular:     Rate and Rhythm: Normal rate and regular rhythm.     Pulses: Normal pulses.     Heart sounds: Normal heart sounds.  Pulmonary:     Effort: Pulmonary effort is normal.     Breath sounds: Normal breath sounds.  Abdominal:     General: Abdomen is flat.     Palpations: Abdomen is soft.  Skin:    General: Skin is warm and dry.  Neurological:     General: No focal deficit present.     Comments: GCS 15. Speech is goal oriented. No deficits appreciated to CN III-XII; symmetric eyebrow raise, no facial drooping, tongue midline. Patient has equal grip strength bilaterally with 5/5 strength against resistance in all major muscle groups bilaterally. Sensation to light touch intact. Patient moves extremities without ataxia. Normal finger-nose-finger. Patient ambulatory with steady gait.  Psychiatric:        Mood and Affect: Mood normal.     (all labs ordered are listed, but only abnormal results are displayed) Labs Reviewed  PREGNANCY, URINE  BASIC METABOLIC PANEL WITH GFR  CBC    EKG: None  Radiology: No results found.   Procedures   Medications Ordered in the ED  sodium chloride  0.9 % bolus 1,000 mL (has no administration in time range)  metoCLOPramide  (REGLAN ) injection 10 mg (has no administration in time range)  diphenhydrAMINE  (BENADRYL ) injection 12.5 mg (has no administration in time range)                                    Medical Decision Making Amount and/or Complexity of Data Reviewed Labs: ordered. Radiology: ordered.  Risk Prescription drug management.   38 year old well-appearing female present for headache that started during intercourse.  Exam is unremarkable.  DDx includes SAH, migraine, meningitis, hypertensive  emergency, vascular artery dissection, other.  Labs and CT angio head neck are pending. At this time she looks well, no acute distress and hemodynamically stable.  Disposition based on CT findings and reassessment after headache cocktail.  Signed out to [employer], [place].      Final diagnoses:  Nonintractable headache, unspecified chronicity pattern, unspecified headache type    ED Discharge Orders     None          Lang Norleen POUR, PA-C 05/09/24 2129    Emil Share, DO 05/09/24 2156

## 2024-07-17 DIAGNOSIS — H5213 Myopia, bilateral: Secondary | ICD-10-CM | POA: Diagnosis not present

## 2024-09-11 DIAGNOSIS — Z13220 Encounter for screening for lipoid disorders: Secondary | ICD-10-CM | POA: Diagnosis not present

## 2024-09-11 DIAGNOSIS — F419 Anxiety disorder, unspecified: Secondary | ICD-10-CM | POA: Diagnosis not present

## 2024-09-11 DIAGNOSIS — G4700 Insomnia, unspecified: Secondary | ICD-10-CM | POA: Diagnosis not present

## 2024-09-11 DIAGNOSIS — R5383 Other fatigue: Secondary | ICD-10-CM | POA: Diagnosis not present

## 2024-09-11 DIAGNOSIS — E039 Hypothyroidism, unspecified: Secondary | ICD-10-CM | POA: Diagnosis not present

## 2024-09-11 DIAGNOSIS — Z131 Encounter for screening for diabetes mellitus: Secondary | ICD-10-CM | POA: Diagnosis not present

## 2024-09-11 DIAGNOSIS — E663 Overweight: Secondary | ICD-10-CM | POA: Diagnosis not present

## 2024-09-11 DIAGNOSIS — Z1329 Encounter for screening for other suspected endocrine disorder: Secondary | ICD-10-CM | POA: Diagnosis not present

## 2024-09-11 DIAGNOSIS — E559 Vitamin D deficiency, unspecified: Secondary | ICD-10-CM | POA: Diagnosis not present

## 2024-09-11 DIAGNOSIS — E611 Iron deficiency: Secondary | ICD-10-CM | POA: Diagnosis not present

## 2024-09-11 DIAGNOSIS — D519 Vitamin B12 deficiency anemia, unspecified: Secondary | ICD-10-CM | POA: Diagnosis not present

## 2024-09-11 DIAGNOSIS — R519 Headache, unspecified: Secondary | ICD-10-CM | POA: Diagnosis not present
